# Patient Record
Sex: Female | Born: 1935 | Race: White | Hispanic: No
Health system: Southern US, Community
[De-identification: ages and names within clinical notes are randomized; demographics above are authoritative.]

---

## 1998-01-25 ENCOUNTER — Ambulatory Visit (HOSPITAL_COMMUNITY): Admission: RE | Admit: 1998-01-25 | Discharge: 1998-01-25 | Payer: Self-pay | Admitting: Orthopedic Surgery

## 1999-05-19 ENCOUNTER — Encounter: Admission: RE | Admit: 1999-05-19 | Discharge: 1999-05-19 | Payer: Self-pay | Admitting: Internal Medicine

## 2000-07-27 ENCOUNTER — Encounter: Payer: Self-pay | Admitting: Emergency Medicine

## 2000-07-27 ENCOUNTER — Emergency Department (HOSPITAL_COMMUNITY): Admission: EM | Admit: 2000-07-27 | Discharge: 2000-07-28 | Payer: Self-pay | Admitting: Emergency Medicine

## 2000-07-28 ENCOUNTER — Encounter: Payer: Self-pay | Admitting: Emergency Medicine

## 2002-12-08 ENCOUNTER — Encounter: Payer: Self-pay | Admitting: Emergency Medicine

## 2002-12-08 ENCOUNTER — Inpatient Hospital Stay (HOSPITAL_COMMUNITY): Admission: EM | Admit: 2002-12-08 | Discharge: 2002-12-11 | Payer: Self-pay | Admitting: Emergency Medicine

## 2003-04-23 ENCOUNTER — Ambulatory Visit (HOSPITAL_COMMUNITY): Admission: RE | Admit: 2003-04-23 | Discharge: 2003-04-23 | Payer: Self-pay | Admitting: Internal Medicine

## 2004-03-24 ENCOUNTER — Ambulatory Visit (HOSPITAL_COMMUNITY): Admission: RE | Admit: 2004-03-24 | Discharge: 2004-03-24 | Payer: Self-pay | Admitting: Internal Medicine

## 2004-06-28 ENCOUNTER — Ambulatory Visit: Payer: Self-pay | Admitting: Internal Medicine

## 2004-07-12 ENCOUNTER — Ambulatory Visit: Payer: Self-pay | Admitting: Internal Medicine

## 2004-10-02 ENCOUNTER — Ambulatory Visit: Payer: Self-pay | Admitting: Internal Medicine

## 2004-10-11 ENCOUNTER — Encounter (HOSPITAL_COMMUNITY): Admission: RE | Admit: 2004-10-11 | Discharge: 2005-01-09 | Payer: Self-pay | Admitting: Internal Medicine

## 2004-10-18 ENCOUNTER — Ambulatory Visit: Payer: Self-pay | Admitting: Endocrinology

## 2004-10-27 ENCOUNTER — Ambulatory Visit (HOSPITAL_COMMUNITY): Admission: RE | Admit: 2004-10-27 | Discharge: 2004-10-27 | Payer: Self-pay | Admitting: Endocrinology

## 2004-12-19 ENCOUNTER — Ambulatory Visit: Payer: Self-pay | Admitting: Endocrinology

## 2004-12-27 ENCOUNTER — Ambulatory Visit: Payer: Self-pay | Admitting: Internal Medicine

## 2005-01-18 ENCOUNTER — Ambulatory Visit: Payer: Self-pay | Admitting: Endocrinology

## 2005-01-24 ENCOUNTER — Ambulatory Visit: Payer: Self-pay | Admitting: Internal Medicine

## 2005-02-01 ENCOUNTER — Ambulatory Visit: Payer: Self-pay | Admitting: Internal Medicine

## 2005-02-15 ENCOUNTER — Ambulatory Visit: Payer: Self-pay | Admitting: Endocrinology

## 2005-04-19 ENCOUNTER — Ambulatory Visit: Payer: Self-pay | Admitting: Endocrinology

## 2005-05-14 ENCOUNTER — Ambulatory Visit: Payer: Self-pay | Admitting: Family Medicine

## 2005-05-21 ENCOUNTER — Ambulatory Visit: Payer: Self-pay | Admitting: Internal Medicine

## 2005-07-02 ENCOUNTER — Ambulatory Visit: Payer: Self-pay | Admitting: Internal Medicine

## 2005-08-13 ENCOUNTER — Ambulatory Visit: Payer: Self-pay | Admitting: Internal Medicine

## 2005-08-28 ENCOUNTER — Ambulatory Visit: Payer: Self-pay | Admitting: Endocrinology

## 2005-09-05 ENCOUNTER — Ambulatory Visit: Payer: Self-pay | Admitting: Pulmonary Disease

## 2005-12-20 ENCOUNTER — Ambulatory Visit: Payer: Self-pay | Admitting: Internal Medicine

## 2006-02-13 ENCOUNTER — Ambulatory Visit: Payer: Self-pay | Admitting: Internal Medicine

## 2006-03-01 ENCOUNTER — Ambulatory Visit: Payer: Self-pay | Admitting: Licensed Clinical Social Worker

## 2006-05-14 ENCOUNTER — Ambulatory Visit: Payer: Self-pay | Admitting: Internal Medicine

## 2006-06-27 ENCOUNTER — Ambulatory Visit: Payer: Self-pay | Admitting: Internal Medicine

## 2006-07-17 ENCOUNTER — Ambulatory Visit: Admission: RE | Admit: 2006-07-17 | Discharge: 2006-07-17 | Payer: Self-pay | Admitting: Internal Medicine

## 2006-07-17 ENCOUNTER — Encounter (INDEPENDENT_AMBULATORY_CARE_PROVIDER_SITE_OTHER): Payer: Self-pay | Admitting: *Deleted

## 2006-07-17 ENCOUNTER — Ambulatory Visit: Payer: Self-pay | Admitting: Internal Medicine

## 2006-07-25 ENCOUNTER — Ambulatory Visit: Payer: Self-pay | Admitting: Internal Medicine

## 2006-08-01 LAB — COMPREHENSIVE METABOLIC PANEL
AST: 24 U/L (ref 0–37)
BUN: 10 mg/dL (ref 6–23)
Calcium: 9.5 mg/dL (ref 8.4–10.5)
Chloride: 105 mEq/L (ref 96–112)
Creatinine, Ser: 0.84 mg/dL (ref 0.40–1.20)
Total Bilirubin: 0.7 mg/dL (ref 0.3–1.2)

## 2006-08-01 LAB — IGG, IGA, IGM
IgA: 215 mg/dL (ref 68–378)
IgG (Immunoglobin G), Serum: 1240 mg/dL (ref 694–1618)
IgM, Serum: 161 mg/dL (ref 60–263)

## 2006-08-01 LAB — CBC WITH DIFFERENTIAL/PLATELET
Basophils Absolute: 0 10*3/uL (ref 0.0–0.1)
EOS%: 6.9 % (ref 0.0–7.0)
HCT: 40.9 % (ref 34.8–46.6)
HGB: 13.7 g/dL (ref 11.6–15.9)
LYMPH%: 12.7 % — ABNORMAL LOW (ref 14.0–48.0)
MCH: 31.4 pg (ref 26.0–34.0)
MCV: 94.1 fL (ref 81.0–101.0)
MONO%: 8.5 % (ref 0.0–13.0)
NEUT%: 71.4 % (ref 39.6–76.8)
Platelets: 334 10*3/uL (ref 145–400)
lymph#: 1.1 10*3/uL (ref 0.9–3.3)

## 2006-08-02 ENCOUNTER — Ambulatory Visit (HOSPITAL_COMMUNITY): Admission: RE | Admit: 2006-08-02 | Discharge: 2006-08-02 | Payer: Self-pay | Admitting: Internal Medicine

## 2006-08-06 ENCOUNTER — Ambulatory Visit (HOSPITAL_COMMUNITY): Admission: RE | Admit: 2006-08-06 | Discharge: 2006-08-06 | Payer: Self-pay | Admitting: Internal Medicine

## 2006-08-07 LAB — CBC WITH DIFFERENTIAL/PLATELET
EOS%: 7.6 % — ABNORMAL HIGH (ref 0.0–7.0)
MCH: 31.1 pg (ref 26.0–34.0)
MCV: 92.2 fL (ref 81.0–101.0)
MONO%: 8.7 % (ref 0.0–13.0)
NEUT#: 4.7 10*3/uL (ref 1.5–6.5)
RBC: 4.06 10*6/uL (ref 3.70–5.32)
RDW: 11.5 % (ref 11.3–14.5)
lymph#: 1.2 10*3/uL (ref 0.9–3.3)

## 2006-08-07 LAB — COMPREHENSIVE METABOLIC PANEL
ALT: 9 U/L (ref 0–35)
AST: 24 U/L (ref 0–37)
Albumin: 4.2 g/dL (ref 3.5–5.2)
Alkaline Phosphatase: 111 U/L (ref 39–117)
Potassium: 4.8 mEq/L (ref 3.5–5.3)
Sodium: 141 mEq/L (ref 135–145)
Total Protein: 7 g/dL (ref 6.0–8.3)

## 2006-08-14 LAB — CBC WITH DIFFERENTIAL/PLATELET
Basophils Absolute: 0 10*3/uL (ref 0.0–0.1)
EOS%: 7.5 % — ABNORMAL HIGH (ref 0.0–7.0)
MCH: 30.5 pg (ref 26.0–34.0)
MCHC: 32.5 g/dL (ref 32.0–36.0)
MCV: 93.8 fL (ref 81.0–101.0)
MONO%: 4.9 % (ref 0.0–13.0)
RBC: 3.56 10*6/uL — ABNORMAL LOW (ref 3.70–5.32)
RDW: 13 % (ref 11.3–14.5)

## 2006-08-14 LAB — COMPREHENSIVE METABOLIC PANEL
AST: 16 U/L (ref 0–37)
Albumin: 4 g/dL (ref 3.5–5.2)
Alkaline Phosphatase: 122 U/L — ABNORMAL HIGH (ref 39–117)
BUN: 13 mg/dL (ref 6–23)
Potassium: 4.1 mEq/L (ref 3.5–5.3)

## 2006-08-21 LAB — COMPREHENSIVE METABOLIC PANEL
ALT: 12 U/L (ref 0–35)
AST: 16 U/L (ref 0–37)
Alkaline Phosphatase: 142 U/L — ABNORMAL HIGH (ref 39–117)
BUN: 8 mg/dL (ref 6–23)
Calcium: 8.7 mg/dL (ref 8.4–10.5)
Creatinine, Ser: 0.81 mg/dL (ref 0.40–1.20)
Total Bilirubin: 0.3 mg/dL (ref 0.3–1.2)

## 2006-08-21 LAB — CBC WITH DIFFERENTIAL/PLATELET
BASO%: 0.1 % (ref 0.0–2.0)
Basophils Absolute: 0 10*3/uL (ref 0.0–0.1)
EOS%: 0.8 % (ref 0.0–7.0)
HCT: 32.3 % — ABNORMAL LOW (ref 34.8–46.6)
HGB: 10.8 g/dL — ABNORMAL LOW (ref 11.6–15.9)
LYMPH%: 8.6 % — ABNORMAL LOW (ref 14.0–48.0)
MCH: 31.3 pg (ref 26.0–34.0)
MCHC: 33.3 g/dL (ref 32.0–36.0)
MCV: 94.1 fL (ref 81.0–101.0)
MONO%: 6 % (ref 0.0–13.0)
NEUT%: 84.5 % — ABNORMAL HIGH (ref 39.6–76.8)

## 2006-08-26 ENCOUNTER — Ambulatory Visit: Admission: RE | Admit: 2006-08-26 | Discharge: 2006-11-05 | Payer: Self-pay | Admitting: Radiation Oncology

## 2006-08-28 LAB — COMPREHENSIVE METABOLIC PANEL
ALT: 8 U/L (ref 0–35)
BUN: 11 mg/dL (ref 6–23)
CO2: 25 mEq/L (ref 19–32)
Calcium: 8.9 mg/dL (ref 8.4–10.5)
Chloride: 110 mEq/L (ref 96–112)
Creatinine, Ser: 0.94 mg/dL (ref 0.40–1.20)
Glucose, Bld: 83 mg/dL (ref 70–99)

## 2006-08-28 LAB — CBC WITH DIFFERENTIAL/PLATELET
BASO%: 1.1 % (ref 0.0–2.0)
Eosinophils Absolute: 0.1 10*3/uL (ref 0.0–0.5)
HCT: 32.6 % — ABNORMAL LOW (ref 34.8–46.6)
MCHC: 33.8 g/dL (ref 32.0–36.0)
MONO#: 0.9 10*3/uL (ref 0.1–0.9)
NEUT#: 6.3 10*3/uL (ref 1.5–6.5)
NEUT%: 74.2 % (ref 39.6–76.8)
RBC: 3.49 10*6/uL — ABNORMAL LOW (ref 3.70–5.32)
WBC: 8.5 10*3/uL (ref 3.9–10.0)
lymph#: 1.1 10*3/uL (ref 0.9–3.3)

## 2006-09-04 LAB — COMPREHENSIVE METABOLIC PANEL
ALT: 10 U/L (ref 0–35)
Alkaline Phosphatase: 161 U/L — ABNORMAL HIGH (ref 39–117)
Sodium: 143 mEq/L (ref 135–145)
Total Bilirubin: 0.5 mg/dL (ref 0.3–1.2)
Total Protein: 6.2 g/dL (ref 6.0–8.3)

## 2006-09-04 LAB — CBC WITH DIFFERENTIAL/PLATELET
Basophils Absolute: 0 10*3/uL (ref 0.0–0.1)
HCT: 28.2 % — ABNORMAL LOW (ref 34.8–46.6)
HGB: 9.8 g/dL — ABNORMAL LOW (ref 11.6–15.9)
MONO#: 0.4 10*3/uL (ref 0.1–0.9)
NEUT%: 86.2 % — ABNORMAL HIGH (ref 39.6–76.8)
WBC: 15.6 10*3/uL — ABNORMAL HIGH (ref 3.9–10.0)
lymph#: 1.5 10*3/uL (ref 0.9–3.3)

## 2006-09-09 ENCOUNTER — Ambulatory Visit: Payer: Self-pay | Admitting: Internal Medicine

## 2006-09-11 ENCOUNTER — Ambulatory Visit (HOSPITAL_COMMUNITY): Admission: RE | Admit: 2006-09-11 | Discharge: 2006-09-11 | Payer: Self-pay | Admitting: Internal Medicine

## 2006-09-11 LAB — COMPREHENSIVE METABOLIC PANEL
Alkaline Phosphatase: 189 U/L — ABNORMAL HIGH (ref 39–117)
BUN: 7 mg/dL (ref 6–23)
Creatinine, Ser: 0.84 mg/dL (ref 0.40–1.20)
Glucose, Bld: 79 mg/dL (ref 70–99)
Sodium: 142 mEq/L (ref 135–145)
Total Bilirubin: 0.3 mg/dL (ref 0.3–1.2)
Total Protein: 7.4 g/dL (ref 6.0–8.3)

## 2006-09-11 LAB — CBC WITH DIFFERENTIAL/PLATELET
Eosinophils Absolute: 0.3 10*3/uL (ref 0.0–0.5)
LYMPH%: 9.9 % — ABNORMAL LOW (ref 14.0–48.0)
MCHC: 33.6 g/dL (ref 32.0–36.0)
MCV: 95.2 fL (ref 81.0–101.0)
MONO%: 5.3 % (ref 0.0–13.0)
NEUT#: 16.1 10*3/uL — ABNORMAL HIGH (ref 1.5–6.5)
NEUT%: 82.6 % — ABNORMAL HIGH (ref 39.6–76.8)
Platelets: 175 10*3/uL (ref 145–400)
RBC: 3.38 10*6/uL — ABNORMAL LOW (ref 3.70–5.32)

## 2006-09-18 LAB — CBC WITH DIFFERENTIAL/PLATELET
Basophils Absolute: 0.1 10*3/uL (ref 0.0–0.1)
Eosinophils Absolute: 0.2 10*3/uL (ref 0.0–0.5)
HCT: 33.3 % — ABNORMAL LOW (ref 34.8–46.6)
HGB: 11.3 g/dL — ABNORMAL LOW (ref 11.6–15.9)
LYMPH%: 9.3 % — ABNORMAL LOW (ref 14.0–48.0)
MCV: 94.9 fL (ref 81.0–101.0)
MONO#: 0.7 10*3/uL (ref 0.1–0.9)
MONO%: 7.9 % (ref 0.0–13.0)
NEUT#: 7 10*3/uL — ABNORMAL HIGH (ref 1.5–6.5)
NEUT%: 79.4 % — ABNORMAL HIGH (ref 39.6–76.8)
Platelets: 316 10*3/uL (ref 145–400)

## 2006-09-18 LAB — COMPREHENSIVE METABOLIC PANEL
Alkaline Phosphatase: 118 U/L — ABNORMAL HIGH (ref 39–117)
BUN: 12 mg/dL (ref 6–23)
CO2: 23 mEq/L (ref 19–32)
Glucose, Bld: 82 mg/dL (ref 70–99)
Total Bilirubin: 0.3 mg/dL (ref 0.3–1.2)

## 2006-09-26 LAB — URINALYSIS, MICROSCOPIC - CHCC
Bilirubin (Urine): NEGATIVE
Blood: NEGATIVE
Glucose: NEGATIVE g/dL
Ketones: NEGATIVE mg/dL
Leukocyte Esterase: NEGATIVE
RBC count: NEGATIVE (ref 0–2)
pH: 7 (ref 4.6–8.0)

## 2006-09-28 LAB — URINE CULTURE

## 2006-10-09 LAB — CBC WITH DIFFERENTIAL/PLATELET
Basophils Absolute: 0.1 10*3/uL (ref 0.0–0.1)
Eosinophils Absolute: 0.2 10*3/uL (ref 0.0–0.5)
HCT: 30.9 % — ABNORMAL LOW (ref 34.8–46.6)
HGB: 10.3 g/dL — ABNORMAL LOW (ref 11.6–15.9)
LYMPH%: 3.9 % — ABNORMAL LOW (ref 14.0–48.0)
MCHC: 33.2 g/dL (ref 32.0–36.0)
MONO#: 0.9 10*3/uL (ref 0.1–0.9)
NEUT#: 8.9 10*3/uL — ABNORMAL HIGH (ref 1.5–6.5)
NEUT%: 85 % — ABNORMAL HIGH (ref 39.6–76.8)
Platelets: 219 10*3/uL (ref 145–400)
WBC: 10.4 10*3/uL — ABNORMAL HIGH (ref 3.9–10.0)
lymph#: 0.4 10*3/uL — ABNORMAL LOW (ref 0.9–3.3)

## 2006-10-09 LAB — COMPREHENSIVE METABOLIC PANEL
ALT: <8 U/L (ref 0–35)
BUN: 11 mg/dL (ref 6–23)
CO2: 23 mEq/L (ref 19–32)
Creatinine, Ser: 0.81 mg/dL (ref 0.40–1.20)
Total Bilirubin: 0.4 mg/dL (ref 0.3–1.2)

## 2006-10-28 ENCOUNTER — Ambulatory Visit (HOSPITAL_COMMUNITY): Admission: RE | Admit: 2006-10-28 | Discharge: 2006-10-28 | Payer: Self-pay | Admitting: Internal Medicine

## 2006-10-28 ENCOUNTER — Ambulatory Visit: Payer: Self-pay | Admitting: Internal Medicine

## 2006-10-30 LAB — COMPREHENSIVE METABOLIC PANEL
ALT: 10 U/L (ref 0–35)
AST: 18 U/L (ref 0–37)
BUN: 8 mg/dL (ref 6–23)
CO2: 25 mEq/L (ref 19–32)
Calcium: 8.8 mg/dL (ref 8.4–10.5)
Chloride: 110 mEq/L (ref 96–112)
Creatinine, Ser: 0.84 mg/dL (ref 0.40–1.20)
Total Bilirubin: 0.3 mg/dL (ref 0.3–1.2)

## 2006-10-30 LAB — CBC WITH DIFFERENTIAL/PLATELET
BASO%: 0.4 % (ref 0.0–2.0)
Basophils Absolute: 0 10*3/uL (ref 0.0–0.1)
EOS%: 2 % (ref 0.0–7.0)
HCT: 28.9 % — ABNORMAL LOW (ref 34.8–46.6)
HGB: 9.9 g/dL — ABNORMAL LOW (ref 11.6–15.9)
LYMPH%: 5.3 % — ABNORMAL LOW (ref 14.0–48.0)
MCH: 34.9 pg — ABNORMAL HIGH (ref 26.0–34.0)
MCHC: 34.3 g/dL (ref 32.0–36.0)
MCV: 101.7 fL — ABNORMAL HIGH (ref 81.0–101.0)
NEUT%: 79.6 % — ABNORMAL HIGH (ref 39.6–76.8)
Platelets: 341 10*3/uL (ref 145–400)

## 2006-11-19 ENCOUNTER — Ambulatory Visit: Payer: Self-pay | Admitting: Internal Medicine

## 2006-11-26 ENCOUNTER — Ambulatory Visit: Payer: Self-pay | Admitting: Internal Medicine

## 2006-12-20 ENCOUNTER — Ambulatory Visit: Payer: Self-pay | Admitting: Internal Medicine

## 2006-12-25 ENCOUNTER — Ambulatory Visit (HOSPITAL_COMMUNITY): Admission: RE | Admit: 2006-12-25 | Discharge: 2006-12-25 | Payer: Self-pay | Admitting: Internal Medicine

## 2007-01-01 ENCOUNTER — Ambulatory Visit: Admission: RE | Admit: 2007-01-01 | Discharge: 2007-02-04 | Payer: Self-pay | Admitting: Radiation Oncology

## 2007-01-03 ENCOUNTER — Ambulatory Visit: Payer: Self-pay | Admitting: Internal Medicine

## 2007-01-29 ENCOUNTER — Ambulatory Visit: Payer: Self-pay | Admitting: Internal Medicine

## 2007-01-29 LAB — CBC WITH DIFFERENTIAL/PLATELET
Basophils Absolute: 0 10*3/uL (ref 0.0–0.1)
HCT: 37.6 % (ref 34.8–46.6)
HGB: 12.8 g/dL (ref 11.6–15.9)
LYMPH%: 7.5 % — ABNORMAL LOW (ref 14.0–48.0)
MCHC: 34 g/dL (ref 32.0–36.0)
MONO#: 0.7 10*3/uL (ref 0.1–0.9)
NEUT%: 77.3 % — ABNORMAL HIGH (ref 39.6–76.8)
Platelets: 240 10*3/uL (ref 145–400)
WBC: 6.3 10*3/uL (ref 3.9–10.0)
lymph#: 0.5 10*3/uL — ABNORMAL LOW (ref 0.9–3.3)

## 2007-01-29 LAB — COMPREHENSIVE METABOLIC PANEL
BUN: 10 mg/dL (ref 6–23)
CO2: 28 mEq/L (ref 19–32)
Calcium: 9.5 mg/dL (ref 8.4–10.5)
Chloride: 105 mEq/L (ref 96–112)
Creatinine, Ser: 0.91 mg/dL (ref 0.40–1.20)
Glucose, Bld: 94 mg/dL (ref 70–99)
Total Bilirubin: 0.5 mg/dL (ref 0.3–1.2)

## 2007-02-18 ENCOUNTER — Ambulatory Visit (HOSPITAL_COMMUNITY): Admission: RE | Admit: 2007-02-18 | Discharge: 2007-02-18 | Payer: Self-pay | Admitting: Internal Medicine

## 2007-02-19 ENCOUNTER — Ambulatory Visit: Payer: Self-pay | Admitting: Internal Medicine

## 2007-02-20 LAB — CBC WITH DIFFERENTIAL/PLATELET
BASO%: 0 % (ref 0.0–2.0)
Basophils Absolute: 0 10*3/uL (ref 0.0–0.1)
Eosinophils Absolute: 0.1 10*3/uL (ref 0.0–0.5)
HCT: 37.6 % (ref 34.8–46.6)
HGB: 12.8 g/dL (ref 11.6–15.9)
LYMPH%: 1.7 % — ABNORMAL LOW (ref 14.0–48.0)
MCHC: 34.1 g/dL (ref 32.0–36.0)
MONO#: 0.2 10*3/uL (ref 0.1–0.9)
NEUT#: 12.4 10*3/uL — ABNORMAL HIGH (ref 1.5–6.5)
NEUT%: 96.2 % — ABNORMAL HIGH (ref 39.6–76.8)
Platelets: 264 10*3/uL (ref 145–400)
WBC: 12.9 10*3/uL — ABNORMAL HIGH (ref 3.9–10.0)
lymph#: 0.2 10*3/uL — ABNORMAL LOW (ref 0.9–3.3)

## 2007-02-20 LAB — COMPREHENSIVE METABOLIC PANEL
ALT: 13 U/L (ref 0–35)
CO2: 24 mEq/L (ref 19–32)
Calcium: 9.1 mg/dL (ref 8.4–10.5)
Chloride: 107 mEq/L (ref 96–112)
Creatinine, Ser: 0.87 mg/dL (ref 0.40–1.20)
Glucose, Bld: 107 mg/dL — ABNORMAL HIGH (ref 70–99)

## 2007-04-14 ENCOUNTER — Ambulatory Visit: Payer: Self-pay | Admitting: Internal Medicine

## 2007-04-16 ENCOUNTER — Ambulatory Visit (HOSPITAL_COMMUNITY): Admission: RE | Admit: 2007-04-16 | Discharge: 2007-04-16 | Payer: Self-pay | Admitting: Internal Medicine

## 2007-04-16 LAB — COMPREHENSIVE METABOLIC PANEL
ALT: 15 U/L (ref 0–35)
Albumin: 4 g/dL (ref 3.5–5.2)
CO2: 30 mEq/L (ref 19–32)
Calcium: 9.6 mg/dL (ref 8.4–10.5)
Chloride: 105 mEq/L (ref 96–112)
Sodium: 142 mEq/L (ref 135–145)
Total Protein: 7.2 g/dL (ref 6.0–8.3)

## 2007-04-16 LAB — CBC WITH DIFFERENTIAL/PLATELET
BASO%: 0 % (ref 0.0–2.0)
Eosinophils Absolute: 0.1 10*3/uL (ref 0.0–0.5)
HCT: 41.4 % (ref 34.8–46.6)
MCHC: 35 g/dL (ref 32.0–36.0)
MONO#: 0.1 10*3/uL (ref 0.1–0.9)
NEUT#: 8.4 10*3/uL — ABNORMAL HIGH (ref 1.5–6.5)
RBC: 4.25 10*6/uL (ref 3.70–5.32)
WBC: 8.9 10*3/uL (ref 3.9–10.0)
lymph#: 0.3 10*3/uL — ABNORMAL LOW (ref 0.9–3.3)

## 2007-04-24 ENCOUNTER — Encounter: Payer: Self-pay | Admitting: *Deleted

## 2007-04-24 DIAGNOSIS — J329 Chronic sinusitis, unspecified: Secondary | ICD-10-CM | POA: Insufficient documentation

## 2007-04-24 DIAGNOSIS — E0590 Thyrotoxicosis, unspecified without thyrotoxic crisis or storm: Secondary | ICD-10-CM | POA: Insufficient documentation

## 2007-04-24 DIAGNOSIS — I6350 Cerebral infarction due to unspecified occlusion or stenosis of unspecified cerebral artery: Secondary | ICD-10-CM | POA: Insufficient documentation

## 2007-04-24 DIAGNOSIS — E785 Hyperlipidemia, unspecified: Secondary | ICD-10-CM

## 2007-04-24 DIAGNOSIS — K5730 Diverticulosis of large intestine without perforation or abscess without bleeding: Secondary | ICD-10-CM | POA: Insufficient documentation

## 2007-04-24 DIAGNOSIS — J449 Chronic obstructive pulmonary disease, unspecified: Secondary | ICD-10-CM | POA: Insufficient documentation

## 2007-04-24 DIAGNOSIS — J4489 Other specified chronic obstructive pulmonary disease: Secondary | ICD-10-CM | POA: Insufficient documentation

## 2007-04-24 DIAGNOSIS — M810 Age-related osteoporosis without current pathological fracture: Secondary | ICD-10-CM | POA: Insufficient documentation

## 2007-05-07 ENCOUNTER — Ambulatory Visit: Payer: Self-pay | Admitting: Internal Medicine

## 2007-05-16 ENCOUNTER — Ambulatory Visit: Payer: Self-pay | Admitting: Internal Medicine

## 2007-05-26 ENCOUNTER — Ambulatory Visit: Payer: Self-pay | Admitting: Internal Medicine

## 2007-05-26 LAB — COMPREHENSIVE METABOLIC PANEL
ALT: 9 U/L (ref 0–35)
CO2: 25 mEq/L (ref 19–32)
Chloride: 110 mEq/L (ref 96–112)
Sodium: 145 mEq/L (ref 135–145)
Total Bilirubin: 0.4 mg/dL (ref 0.3–1.2)
Total Protein: 6.8 g/dL (ref 6.0–8.3)

## 2007-05-26 LAB — CBC WITH DIFFERENTIAL/PLATELET
BASO%: 0.4 % (ref 0.0–2.0)
LYMPH%: 3.9 % — ABNORMAL LOW (ref 14.0–48.0)
MCHC: 34.8 g/dL (ref 32.0–36.0)
MONO#: 0.5 10*3/uL (ref 0.1–0.9)
RBC: 4.14 10*6/uL (ref 3.70–5.32)
RDW: 12.7 % (ref 11.3–14.5)
WBC: 8.7 10*3/uL (ref 3.9–10.0)
lymph#: 0.3 10*3/uL — ABNORMAL LOW (ref 0.9–3.3)

## 2007-06-05 LAB — CBC WITH DIFFERENTIAL/PLATELET
BASO%: 0.5 % (ref 0.0–2.0)
EOS%: 1.4 % (ref 0.0–7.0)
LYMPH%: 4.6 % — ABNORMAL LOW (ref 14.0–48.0)
MCHC: 34.8 g/dL (ref 32.0–36.0)
MONO#: 0.4 10*3/uL (ref 0.1–0.9)
MONO%: 4.4 % (ref 0.0–13.0)
Platelets: 196 10*3/uL (ref 145–400)
RBC: 3.76 10*6/uL (ref 3.70–5.32)
WBC: 8.8 10*3/uL (ref 3.9–10.0)

## 2007-06-05 LAB — COMPREHENSIVE METABOLIC PANEL
ALT: 9 U/L (ref 0–35)
AST: 19 U/L (ref 0–37)
Alkaline Phosphatase: 61 U/L (ref 39–117)
Sodium: 144 mEq/L (ref 135–145)
Total Bilirubin: 0.5 mg/dL (ref 0.3–1.2)
Total Protein: 6.5 g/dL (ref 6.0–8.3)

## 2007-06-11 DIAGNOSIS — C3490 Malignant neoplasm of unspecified part of unspecified bronchus or lung: Secondary | ICD-10-CM | POA: Insufficient documentation

## 2007-06-19 LAB — CBC WITH DIFFERENTIAL/PLATELET
BASO%: 2.4 % — ABNORMAL HIGH (ref 0.0–2.0)
Basophils Absolute: 0.1 10*3/uL (ref 0.0–0.1)
EOS%: 5.2 % (ref 0.0–7.0)
HCT: 33.7 % — ABNORMAL LOW (ref 34.8–46.6)
LYMPH%: 19.7 % (ref 14.0–48.0)
MCH: 34.3 pg — ABNORMAL HIGH (ref 26.0–34.0)
MCHC: 35.8 g/dL (ref 32.0–36.0)
NEUT%: 56.7 % (ref 39.6–76.8)
Platelets: 212 10*3/uL (ref 145–400)

## 2007-06-19 LAB — COMPREHENSIVE METABOLIC PANEL
ALT: 11 U/L (ref 0–35)
AST: 19 U/L (ref 0–37)
BUN: 10 mg/dL (ref 6–23)
CO2: 20 mEq/L (ref 19–32)
Creatinine, Ser: 0.91 mg/dL (ref 0.40–1.20)
Total Bilirubin: 0.4 mg/dL (ref 0.3–1.2)

## 2007-06-19 LAB — MAGNESIUM: Magnesium: 2 mg/dL (ref 1.5–2.5)

## 2007-06-27 LAB — COMPREHENSIVE METABOLIC PANEL
AST: 21 U/L (ref 0–37)
Albumin: 4.1 g/dL (ref 3.5–5.2)
BUN: 12 mg/dL (ref 6–23)
CO2: 20 mEq/L (ref 19–32)
Calcium: 9 mg/dL (ref 8.4–10.5)
Chloride: 114 mEq/L — ABNORMAL HIGH (ref 96–112)
Creatinine, Ser: 0.9 mg/dL (ref 0.40–1.20)
Glucose, Bld: 67 mg/dL — ABNORMAL LOW (ref 70–99)
Potassium: 3.9 mEq/L (ref 3.5–5.3)

## 2007-06-27 LAB — CBC WITH DIFFERENTIAL/PLATELET
Basophils Absolute: 0 10*3/uL (ref 0.0–0.1)
EOS%: 2.1 % (ref 0.0–7.0)
Eosinophils Absolute: 0.1 10*3/uL (ref 0.0–0.5)
HCT: 32.6 % — ABNORMAL LOW (ref 34.8–46.6)
HGB: 11.2 g/dL — ABNORMAL LOW (ref 11.6–15.9)
MCH: 33.4 pg (ref 26.0–34.0)
NEUT#: 2.9 10*3/uL (ref 1.5–6.5)
NEUT%: 72.2 % (ref 39.6–76.8)
RDW: 12.5 % (ref 11.3–14.5)
lymph#: 0.5 10*3/uL — ABNORMAL LOW (ref 0.9–3.3)

## 2007-06-27 LAB — MAGNESIUM: Magnesium: 2 mg/dL (ref 1.5–2.5)

## 2007-07-10 LAB — CBC WITH DIFFERENTIAL/PLATELET
Basophils Absolute: 0.1 10*3/uL (ref 0.0–0.1)
Eosinophils Absolute: 0.1 10*3/uL (ref 0.0–0.5)
HGB: 10.5 g/dL — ABNORMAL LOW (ref 11.6–15.9)
MONO%: 20.3 % — ABNORMAL HIGH (ref 0.0–13.0)
NEUT#: 2.4 10*3/uL (ref 1.5–6.5)
RBC: 3.12 10*6/uL — ABNORMAL LOW (ref 3.70–5.32)
RDW: 13.5 % (ref 11.3–14.5)
WBC: 4 10*3/uL (ref 3.9–10.0)
lymph#: 0.6 10*3/uL — ABNORMAL LOW (ref 0.9–3.3)

## 2007-07-10 LAB — COMPREHENSIVE METABOLIC PANEL
AST: 17 U/L (ref 0–37)
Alkaline Phosphatase: 60 U/L (ref 39–117)
BUN: 13 mg/dL (ref 6–23)
Chloride: 114 mEq/L — ABNORMAL HIGH (ref 96–112)
Glucose, Bld: 97 mg/dL (ref 70–99)
Potassium: 4.7 mEq/L (ref 3.5–5.3)
Sodium: 144 mEq/L (ref 135–145)

## 2007-07-10 LAB — MAGNESIUM: Magnesium: 2 mg/dL (ref 1.5–2.5)

## 2007-07-15 ENCOUNTER — Ambulatory Visit (HOSPITAL_COMMUNITY): Admission: RE | Admit: 2007-07-15 | Discharge: 2007-07-15 | Payer: Self-pay | Admitting: Internal Medicine

## 2007-07-15 ENCOUNTER — Ambulatory Visit: Payer: Self-pay | Admitting: Internal Medicine

## 2007-07-17 LAB — CBC WITH DIFFERENTIAL/PLATELET
BASO%: 0.4 % (ref 0.0–2.0)
EOS%: 2.2 % (ref 0.0–7.0)
HCT: 31 % — ABNORMAL LOW (ref 34.8–46.6)
LYMPH%: 15.6 % (ref 14.0–48.0)
MCH: 34.8 pg — ABNORMAL HIGH (ref 26.0–34.0)
MCHC: 35.1 g/dL (ref 32.0–36.0)
MONO#: 0.6 10*3/uL (ref 0.1–0.9)
MONO%: 16.8 % — ABNORMAL HIGH (ref 0.0–13.0)
NEUT%: 65 % (ref 39.6–76.8)
Platelets: 248 10*3/uL (ref 145–400)
RBC: 3.12 10*6/uL — ABNORMAL LOW (ref 3.70–5.32)
WBC: 3.4 10*3/uL — ABNORMAL LOW (ref 3.9–10.0)

## 2007-07-30 LAB — CBC WITH DIFFERENTIAL/PLATELET
BASO%: 1.2 % (ref 0.0–2.0)
EOS%: 3.3 % (ref 0.0–7.0)
MCH: 34.8 pg — ABNORMAL HIGH (ref 26.0–34.0)
MCHC: 34.4 g/dL (ref 32.0–36.0)
MONO#: 0.6 10*3/uL (ref 0.1–0.9)
NEUT%: 70.1 % (ref 39.6–76.8)
RBC: 2.94 10*6/uL — ABNORMAL LOW (ref 3.70–5.32)
RDW: 15.2 % — ABNORMAL HIGH (ref 11.3–14.5)
WBC: 4.2 10*3/uL (ref 3.9–10.0)
lymph#: 0.5 10*3/uL — ABNORMAL LOW (ref 0.9–3.3)

## 2007-07-30 LAB — COMPREHENSIVE METABOLIC PANEL
ALT: 10 U/L (ref 0–35)
AST: 22 U/L (ref 0–37)
CO2: 19 mEq/L (ref 19–32)
Calcium: 9.1 mg/dL (ref 8.4–10.5)
Chloride: 112 mEq/L (ref 96–112)
Creatinine, Ser: 0.86 mg/dL (ref 0.40–1.20)
Potassium: 4.4 mEq/L (ref 3.5–5.3)
Sodium: 143 mEq/L (ref 135–145)
Total Protein: 6.5 g/dL (ref 6.0–8.3)

## 2007-08-07 LAB — CBC WITH DIFFERENTIAL/PLATELET
BASO%: 1.5 % (ref 0.0–2.0)
Basophils Absolute: 0.1 10*3/uL (ref 0.0–0.1)
EOS%: 1.3 % (ref 0.0–7.0)
HGB: 10.5 g/dL — ABNORMAL LOW (ref 11.6–15.9)
MCH: 35.9 pg — ABNORMAL HIGH (ref 26.0–34.0)
MCHC: 35 g/dL (ref 32.0–36.0)
MCV: 102 fL — ABNORMAL HIGH (ref 81.0–101.0)
MONO%: 11.7 % (ref 0.0–13.0)
NEUT%: 71.1 % (ref 39.6–76.8)
RDW: 16 % — ABNORMAL HIGH (ref 11.3–14.5)

## 2007-08-14 LAB — CBC WITH DIFFERENTIAL/PLATELET
BASO%: 1.2 % (ref 0.0–2.0)
EOS%: 1.2 % (ref 0.0–7.0)
Eosinophils Absolute: 0.1 10*3/uL (ref 0.0–0.5)
LYMPH%: 19.5 % (ref 14.0–48.0)
MCH: 35.6 pg — ABNORMAL HIGH (ref 26.0–34.0)
MCHC: 34.6 g/dL (ref 32.0–36.0)
MCV: 103 fL — ABNORMAL HIGH (ref 81.0–101.0)
MONO%: 11 % (ref 0.0–13.0)
Platelets: 184 10*3/uL (ref 145–400)
RBC: 3.03 10*6/uL — ABNORMAL LOW (ref 3.70–5.32)
RDW: 15.1 % — ABNORMAL HIGH (ref 11.3–14.5)

## 2007-08-21 ENCOUNTER — Encounter: Payer: Self-pay | Admitting: Internal Medicine

## 2007-08-21 LAB — COMPREHENSIVE METABOLIC PANEL
ALT: 11 U/L (ref 0–35)
Albumin: 4 g/dL (ref 3.5–5.2)
Alkaline Phosphatase: 55 U/L (ref 39–117)
CO2: 21 mEq/L (ref 19–32)
Potassium: 4.2 mEq/L (ref 3.5–5.3)
Sodium: 143 mEq/L (ref 135–145)
Total Bilirubin: 0.5 mg/dL (ref 0.3–1.2)
Total Protein: 6.5 g/dL (ref 6.0–8.3)

## 2007-08-21 LAB — CBC WITH DIFFERENTIAL/PLATELET
BASO%: 0.2 % (ref 0.0–2.0)
LYMPH%: 7.5 % — ABNORMAL LOW (ref 14.0–48.0)
MCHC: 34.3 g/dL (ref 32.0–36.0)
MONO#: 0.4 10*3/uL (ref 0.1–0.9)
NEUT#: 3.6 10*3/uL (ref 1.5–6.5)
Platelets: 253 10*3/uL (ref 145–400)
RBC: 3.11 10*6/uL — ABNORMAL LOW (ref 3.70–5.32)
RDW: 20.8 % — ABNORMAL HIGH (ref 11.3–14.5)
WBC: 4.4 10*3/uL (ref 3.9–10.0)

## 2007-08-21 LAB — MAGNESIUM: Magnesium: 1.8 mg/dL (ref 1.5–2.5)

## 2007-08-26 ENCOUNTER — Ambulatory Visit: Payer: Self-pay | Admitting: Internal Medicine

## 2007-08-28 LAB — COMPREHENSIVE METABOLIC PANEL
AST: 24 U/L (ref 0–37)
Alkaline Phosphatase: 63 U/L (ref 39–117)
BUN: 12 mg/dL (ref 6–23)
Glucose, Bld: 112 mg/dL — ABNORMAL HIGH (ref 70–99)
Sodium: 142 mEq/L (ref 135–145)
Total Bilirubin: 0.6 mg/dL (ref 0.3–1.2)

## 2007-08-28 LAB — CBC WITH DIFFERENTIAL/PLATELET
BASO%: 2.6 % — ABNORMAL HIGH (ref 0.0–2.0)
EOS%: 0.5 % (ref 0.0–7.0)
HCT: 31.7 % — ABNORMAL LOW (ref 34.8–46.6)
LYMPH%: 8 % — ABNORMAL LOW (ref 14.0–48.0)
MCH: 36.5 pg — ABNORMAL HIGH (ref 26.0–34.0)
MCHC: 35.7 g/dL (ref 32.0–36.0)
NEUT%: 69.6 % (ref 39.6–76.8)
Platelets: 160 10*3/uL (ref 145–400)

## 2007-09-02 ENCOUNTER — Ambulatory Visit: Payer: Self-pay | Admitting: Internal Medicine

## 2007-09-02 DIAGNOSIS — R5383 Other fatigue: Secondary | ICD-10-CM

## 2007-09-02 DIAGNOSIS — R5381 Other malaise: Secondary | ICD-10-CM | POA: Insufficient documentation

## 2007-09-02 LAB — CONVERTED CEMR LAB
ALT: 17 units/L (ref 0–35)
Albumin: 3.8 g/dL (ref 3.5–5.2)
Alkaline Phosphatase: 68 units/L (ref 39–117)
BUN: 14 mg/dL (ref 6–23)
Basophils Absolute: 0.1 10*3/uL (ref 0.0–0.1)
Basophils Relative: 2.1 % — ABNORMAL HIGH (ref 0.0–1.0)
Calcium: 9.5 mg/dL (ref 8.4–10.5)
Creatinine, Ser: 1.1 mg/dL (ref 0.4–1.2)
GFR calc Af Amer: 62 mL/min
Hemoglobin: 11.3 g/dL — ABNORMAL LOW (ref 12.0–15.0)
MCHC: 32.8 g/dL (ref 30.0–36.0)
Monocytes Absolute: 0.2 10*3/uL (ref 0.2–0.7)
Monocytes Relative: 7.2 % (ref 3.0–11.0)
Neutro Abs: 2.1 10*3/uL (ref 1.4–7.7)
Platelets: 170 10*3/uL (ref 150–400)
Potassium: 3.9 meq/L (ref 3.5–5.1)
RDW: 15 % — ABNORMAL HIGH (ref 11.5–14.6)

## 2007-09-04 LAB — CBC WITH DIFFERENTIAL/PLATELET
Basophils Absolute: 0 10*3/uL (ref 0.0–0.1)
Eosinophils Absolute: 0 10*3/uL (ref 0.0–0.5)
HCT: 27.9 % — ABNORMAL LOW (ref 34.8–46.6)
HGB: 10.2 g/dL — ABNORMAL LOW (ref 11.6–15.9)
LYMPH%: 20.7 % (ref 14.0–48.0)
MCHC: 36.6 g/dL — ABNORMAL HIGH (ref 32.0–36.0)
MONO#: 0.3 10*3/uL (ref 0.1–0.9)
NEUT%: 62 % (ref 39.6–76.8)
Platelets: 126 10*3/uL — ABNORMAL LOW (ref 145–400)
WBC: 2.3 10*3/uL — ABNORMAL LOW (ref 3.9–10.0)
lymph#: 0.5 10*3/uL — ABNORMAL LOW (ref 0.9–3.3)

## 2007-09-11 ENCOUNTER — Encounter: Payer: Self-pay | Admitting: Internal Medicine

## 2007-09-11 LAB — CBC WITH DIFFERENTIAL/PLATELET
BASO%: 2.1 % — ABNORMAL HIGH (ref 0.0–2.0)
Basophils Absolute: 0.1 10*3/uL (ref 0.0–0.1)
EOS%: 4.8 % (ref 0.0–7.0)
HCT: 31.1 % — ABNORMAL LOW (ref 34.8–46.6)
HGB: 10.8 g/dL — ABNORMAL LOW (ref 11.6–15.9)
LYMPH%: 19.7 % (ref 14.0–48.0)
MCH: 35.5 pg — ABNORMAL HIGH (ref 26.0–34.0)
MCHC: 34.9 g/dL (ref 32.0–36.0)
MCV: 102 fL — ABNORMAL HIGH (ref 81.0–101.0)
MONO%: 19.4 % — ABNORMAL HIGH (ref 0.0–13.0)
NEUT%: 54 % (ref 39.6–76.8)
Platelets: 190 10*3/uL (ref 145–400)
lymph#: 0.6 10*3/uL — ABNORMAL LOW (ref 0.9–3.3)

## 2007-09-11 LAB — COMPREHENSIVE METABOLIC PANEL
ALT: 10 U/L (ref 0–35)
AST: 22 U/L (ref 0–37)
BUN: 7 mg/dL (ref 6–23)
Calcium: 9.5 mg/dL (ref 8.4–10.5)
Chloride: 111 mEq/L (ref 96–112)
Creatinine, Ser: 0.99 mg/dL (ref 0.40–1.20)
Total Bilirubin: 0.3 mg/dL (ref 0.3–1.2)

## 2007-09-18 LAB — CBC WITH DIFFERENTIAL/PLATELET
BASO%: 0.3 % (ref 0.0–2.0)
EOS%: 2.3 % (ref 0.0–7.0)
HCT: 32.3 % — ABNORMAL LOW (ref 34.8–46.6)
LYMPH%: 16.1 % (ref 14.0–48.0)
MCH: 36.9 pg — ABNORMAL HIGH (ref 26.0–34.0)
MCHC: 35.2 g/dL (ref 32.0–36.0)
NEUT%: 60 % (ref 39.6–76.8)
Platelets: 227 10*3/uL (ref 145–400)
RBC: 3.08 10*6/uL — ABNORMAL LOW (ref 3.70–5.32)

## 2007-09-18 LAB — COMPREHENSIVE METABOLIC PANEL
ALT: 8 U/L (ref 0–35)
AST: 20 U/L (ref 0–37)
Alkaline Phosphatase: 71 U/L (ref 39–117)
Creatinine, Ser: 1.23 mg/dL — ABNORMAL HIGH (ref 0.40–1.20)
Sodium: 144 mEq/L (ref 135–145)
Total Bilirubin: 0.4 mg/dL (ref 0.3–1.2)
Total Protein: 6.4 g/dL (ref 6.0–8.3)

## 2007-09-23 ENCOUNTER — Ambulatory Visit: Payer: Self-pay | Admitting: Internal Medicine

## 2007-09-26 ENCOUNTER — Ambulatory Visit (HOSPITAL_COMMUNITY): Admission: RE | Admit: 2007-09-26 | Discharge: 2007-09-26 | Payer: Self-pay | Admitting: Gynecology

## 2007-09-26 ENCOUNTER — Ambulatory Visit: Admission: RE | Admit: 2007-09-26 | Discharge: 2007-09-26 | Payer: Self-pay | Admitting: Gynecology

## 2007-10-02 ENCOUNTER — Ambulatory Visit (HOSPITAL_COMMUNITY): Admission: RE | Admit: 2007-10-02 | Discharge: 2007-10-02 | Payer: Self-pay | Admitting: Internal Medicine

## 2007-10-02 ENCOUNTER — Encounter: Payer: Self-pay | Admitting: Internal Medicine

## 2007-10-02 LAB — CBC WITH DIFFERENTIAL/PLATELET
Basophils Absolute: 0 10*3/uL (ref 0.0–0.1)
EOS%: 1.3 % (ref 0.0–7.0)
Eosinophils Absolute: 0 10*3/uL (ref 0.0–0.5)
HGB: 10.7 g/dL — ABNORMAL LOW (ref 11.6–15.9)
NEUT#: 2.1 10*3/uL (ref 1.5–6.5)
RDW: 15.7 % — ABNORMAL HIGH (ref 11.3–14.5)
lymph#: 0.4 10*3/uL — ABNORMAL LOW (ref 0.9–3.3)

## 2007-10-02 LAB — COMPREHENSIVE METABOLIC PANEL
AST: 18 U/L (ref 0–37)
Albumin: 4.2 g/dL (ref 3.5–5.2)
BUN: 17 mg/dL (ref 6–23)
Calcium: 8.6 mg/dL (ref 8.4–10.5)
Chloride: 111 mEq/L (ref 96–112)
Potassium: 4.6 mEq/L (ref 3.5–5.3)
Total Protein: 6.7 g/dL (ref 6.0–8.3)

## 2007-10-06 LAB — PROTIME-INR
INR: 3.6 — ABNORMAL HIGH (ref 2.00–3.50)
Protime: 43.2 Seconds — ABNORMAL HIGH (ref 10.6–13.4)

## 2007-10-06 LAB — CBC WITH DIFFERENTIAL/PLATELET
BASO%: 1.8 % (ref 0.0–2.0)
Basophils Absolute: 0.1 10*3/uL (ref 0.0–0.1)
EOS%: 2.5 % (ref 0.0–7.0)
HGB: 10.7 g/dL — ABNORMAL LOW (ref 11.6–15.9)
MCH: 36.1 pg — ABNORMAL HIGH (ref 26.0–34.0)
MCHC: 33.2 g/dL (ref 32.0–36.0)
RBC: 2.95 10*6/uL — ABNORMAL LOW (ref 3.70–5.32)
RDW: 14.3 % (ref 11.3–14.5)
lymph#: 0.8 10*3/uL — ABNORMAL LOW (ref 0.9–3.3)

## 2007-10-09 ENCOUNTER — Ambulatory Visit: Payer: Self-pay | Admitting: Internal Medicine

## 2007-10-13 LAB — CBC WITH DIFFERENTIAL/PLATELET
BASO%: 0.7 % (ref 0.0–2.0)
EOS%: 1.2 % (ref 0.0–7.0)
MCH: 35.5 pg — ABNORMAL HIGH (ref 26.0–34.0)
MCHC: 33.9 g/dL (ref 32.0–36.0)
MCV: 105 fL — ABNORMAL HIGH (ref 81.0–101.0)
MONO%: 8.9 % (ref 0.0–13.0)
RBC: 3.18 10*6/uL — ABNORMAL LOW (ref 3.70–5.32)
RDW: 13.4 % (ref 11.3–14.5)
lymph#: 0.6 10*3/uL — ABNORMAL LOW (ref 0.9–3.3)

## 2007-10-13 LAB — PROTIME-INR: INR: 2.2 (ref 2.00–3.50)

## 2007-10-14 ENCOUNTER — Telehealth (INDEPENDENT_AMBULATORY_CARE_PROVIDER_SITE_OTHER): Payer: Self-pay | Admitting: *Deleted

## 2007-10-20 LAB — CBC WITH DIFFERENTIAL/PLATELET
BASO%: 1 % (ref 0.0–2.0)
Basophils Absolute: 0.1 10*3/uL (ref 0.0–0.1)
EOS%: 1.8 % (ref 0.0–7.0)
HCT: 31 % — ABNORMAL LOW (ref 34.8–46.6)
HGB: 10.7 g/dL — ABNORMAL LOW (ref 11.6–15.9)
LYMPH%: 7.1 % — ABNORMAL LOW (ref 14.0–48.0)
MCH: 36.2 pg — ABNORMAL HIGH (ref 26.0–34.0)
MCHC: 34.6 g/dL (ref 32.0–36.0)
MONO#: 0.6 10*3/uL (ref 0.1–0.9)
NEUT%: 83.1 % — ABNORMAL HIGH (ref 39.6–76.8)
Platelets: 197 10*3/uL (ref 145–400)
lymph#: 0.6 10*3/uL — ABNORMAL LOW (ref 0.9–3.3)

## 2007-10-20 LAB — PROTIME-INR

## 2007-10-21 ENCOUNTER — Ambulatory Visit: Payer: Self-pay | Admitting: Internal Medicine

## 2007-10-24 ENCOUNTER — Telehealth: Payer: Self-pay | Admitting: Internal Medicine

## 2007-10-27 ENCOUNTER — Telehealth (INDEPENDENT_AMBULATORY_CARE_PROVIDER_SITE_OTHER): Payer: Self-pay | Admitting: *Deleted

## 2007-10-28 ENCOUNTER — Telehealth (INDEPENDENT_AMBULATORY_CARE_PROVIDER_SITE_OTHER): Payer: Self-pay | Admitting: *Deleted

## 2007-10-30 LAB — CBC WITH DIFFERENTIAL/PLATELET
Basophils Absolute: 0.1 10*3/uL (ref 0.0–0.1)
Eosinophils Absolute: 0.2 10*3/uL (ref 0.0–0.5)
HGB: 10.5 g/dL — ABNORMAL LOW (ref 11.6–15.9)
LYMPH%: 6.4 % — ABNORMAL LOW (ref 14.0–48.0)
MCV: 105 fL — ABNORMAL HIGH (ref 81.0–101.0)
MONO%: 8.5 % (ref 0.0–13.0)
NEUT#: 5.5 10*3/uL (ref 1.5–6.5)
Platelets: 206 10*3/uL (ref 145–400)
RBC: 2.97 10*6/uL — ABNORMAL LOW (ref 3.70–5.32)

## 2007-11-04 ENCOUNTER — Telehealth: Payer: Self-pay | Admitting: Internal Medicine

## 2007-11-04 ENCOUNTER — Ambulatory Visit: Payer: Self-pay | Admitting: Internal Medicine

## 2007-11-06 ENCOUNTER — Telehealth (INDEPENDENT_AMBULATORY_CARE_PROVIDER_SITE_OTHER): Payer: Self-pay | Admitting: *Deleted

## 2007-11-06 LAB — PROTIME-INR: INR: 2.1 (ref 2.00–3.50)

## 2007-11-10 ENCOUNTER — Ambulatory Visit: Payer: Self-pay | Admitting: Internal Medicine

## 2007-11-10 DIAGNOSIS — B370 Candidal stomatitis: Secondary | ICD-10-CM

## 2007-11-10 LAB — CONVERTED CEMR LAB
BUN: 22 mg/dL (ref 6–23)
Basophils Relative: 1.7 % — ABNORMAL HIGH (ref 0.0–1.0)
Calcium: 10 mg/dL (ref 8.4–10.5)
Creatinine, Ser: 1.3 mg/dL — ABNORMAL HIGH (ref 0.4–1.2)
Eosinophils Absolute: 0.1 10*3/uL (ref 0.0–0.7)
Eosinophils Relative: 1.6 % (ref 0.0–5.0)
GFR calc Af Amer: 51 mL/min
GFR calc non Af Amer: 42 mL/min
HCT: 30.7 % — ABNORMAL LOW (ref 36.0–46.0)
MCHC: 33.3 g/dL (ref 30.0–36.0)
MCV: 103.2 fL — ABNORMAL HIGH (ref 78.0–100.0)
Monocytes Absolute: 0.8 10*3/uL (ref 0.1–1.0)
Neutro Abs: 6.6 10*3/uL (ref 1.4–7.7)
RBC: 2.97 M/uL — ABNORMAL LOW (ref 3.87–5.11)
WBC: 8.1 10*3/uL (ref 4.5–10.5)

## 2007-11-11 ENCOUNTER — Ambulatory Visit: Payer: Self-pay | Admitting: Cardiology

## 2007-11-12 ENCOUNTER — Ambulatory Visit: Admission: RE | Admit: 2007-11-12 | Discharge: 2007-12-28 | Payer: Self-pay | Admitting: Radiation Oncology

## 2007-11-13 LAB — PROTIME-INR: INR: 3.2 (ref 2.00–3.50)

## 2007-11-14 ENCOUNTER — Ambulatory Visit: Payer: Self-pay | Admitting: Internal Medicine

## 2007-11-14 DIAGNOSIS — R300 Dysuria: Secondary | ICD-10-CM

## 2007-11-18 LAB — CONVERTED CEMR LAB
Bilirubin Urine: NEGATIVE
Crystals: NEGATIVE
Specific Gravity, Urine: 1.02 (ref 1.000–1.03)
Urine Glucose: NEGATIVE mg/dL
Urobilinogen, UA: 0.2 (ref 0.0–1.0)

## 2007-11-24 ENCOUNTER — Ambulatory Visit: Payer: Self-pay | Admitting: Internal Medicine

## 2007-11-24 LAB — PROTIME-INR
INR: 2 (ref 2.00–3.50)
Protime: 24 Seconds — ABNORMAL HIGH (ref 10.6–13.4)

## 2007-12-02 ENCOUNTER — Ambulatory Visit (HOSPITAL_COMMUNITY): Admission: RE | Admit: 2007-12-02 | Discharge: 2007-12-02 | Payer: Self-pay | Admitting: Internal Medicine

## 2007-12-08 ENCOUNTER — Telehealth (INDEPENDENT_AMBULATORY_CARE_PROVIDER_SITE_OTHER): Payer: Self-pay | Admitting: *Deleted

## 2007-12-09 ENCOUNTER — Ambulatory Visit: Payer: Self-pay | Admitting: Pulmonary Disease

## 2007-12-09 ENCOUNTER — Encounter: Payer: Self-pay | Admitting: Internal Medicine

## 2007-12-10 ENCOUNTER — Encounter: Payer: Self-pay | Admitting: Internal Medicine

## 2007-12-10 ENCOUNTER — Telehealth (INDEPENDENT_AMBULATORY_CARE_PROVIDER_SITE_OTHER): Payer: Self-pay | Admitting: *Deleted

## 2007-12-11 ENCOUNTER — Telehealth (INDEPENDENT_AMBULATORY_CARE_PROVIDER_SITE_OTHER): Payer: Self-pay | Admitting: *Deleted

## 2007-12-12 ENCOUNTER — Telehealth: Payer: Self-pay | Admitting: Internal Medicine

## 2007-12-12 DIAGNOSIS — K5900 Constipation, unspecified: Secondary | ICD-10-CM | POA: Insufficient documentation

## 2007-12-12 DIAGNOSIS — R609 Edema, unspecified: Secondary | ICD-10-CM | POA: Insufficient documentation

## 2007-12-15 ENCOUNTER — Telehealth (INDEPENDENT_AMBULATORY_CARE_PROVIDER_SITE_OTHER): Payer: Self-pay | Admitting: *Deleted

## 2007-12-16 ENCOUNTER — Telehealth (INDEPENDENT_AMBULATORY_CARE_PROVIDER_SITE_OTHER): Payer: Self-pay | Admitting: *Deleted

## 2007-12-16 ENCOUNTER — Ambulatory Visit: Payer: Self-pay | Admitting: Internal Medicine

## 2007-12-17 ENCOUNTER — Telehealth (INDEPENDENT_AMBULATORY_CARE_PROVIDER_SITE_OTHER): Payer: Self-pay | Admitting: *Deleted

## 2007-12-23 ENCOUNTER — Telehealth: Payer: Self-pay | Admitting: Internal Medicine

## 2007-12-24 ENCOUNTER — Ambulatory Visit: Payer: Self-pay | Admitting: Internal Medicine

## 2007-12-25 ENCOUNTER — Ambulatory Visit: Payer: Self-pay | Admitting: Critical Care Medicine

## 2007-12-25 ENCOUNTER — Inpatient Hospital Stay (HOSPITAL_COMMUNITY): Admission: EM | Admit: 2007-12-25 | Discharge: 2007-12-30 | Payer: Self-pay | Admitting: Emergency Medicine

## 2007-12-30 ENCOUNTER — Encounter: Payer: Self-pay | Admitting: Internal Medicine

## 2008-01-28 DEATH — deceased

## 2008-09-20 IMAGING — CT CT ABDOMEN W/ CM
2 of 5 series · 16 of 46 positions shown, 18 images · IV contrast (agent unspecified)
Comparison: 10/02/2007

CT CHEST

CLINICAL DATA: Air

CT CHEST, ABDOMEN AND PELVIS WITH CONTRAST
TECHNIQUE: Multidetector CT imaging of the chest, abdomen and
pelvis was performed following the standard protocol during bolus
administration of intravenous contrast.
Contrast: 100 ml Xmnipaque-PGG

[Series 2: cap 5.0 b40f · axial · 0.62mm/px · z∈[-534,-4]mm · 13 of 122 slices shown, 15 images]
[im 8/122  soft-tissue]
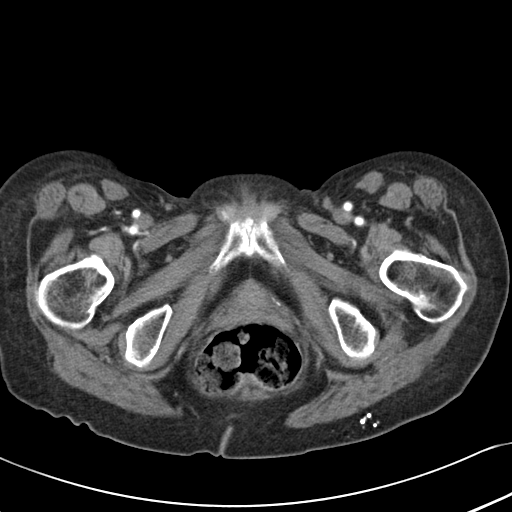
[im 8/122  bone]
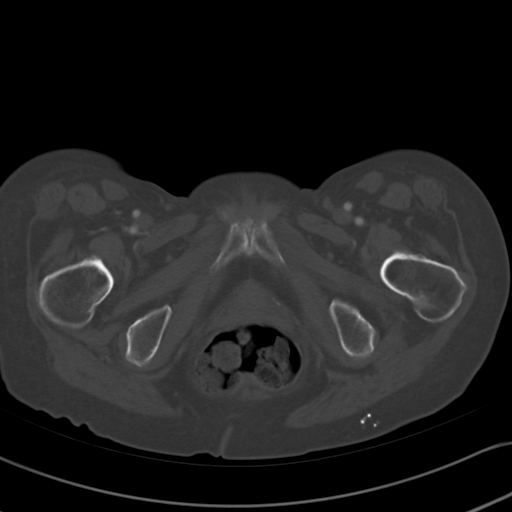
[im 15/122  soft-tissue]
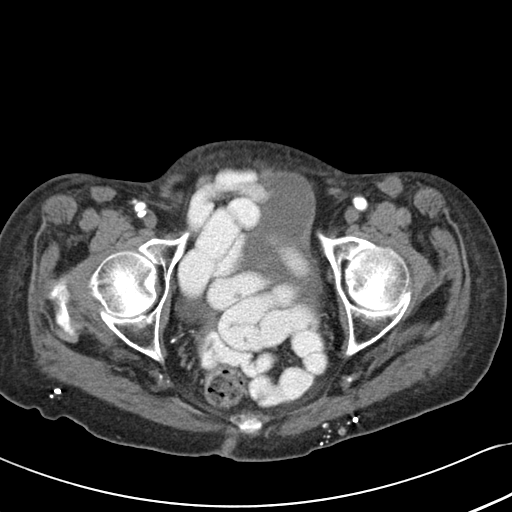
[im 29/122  soft-tissue]
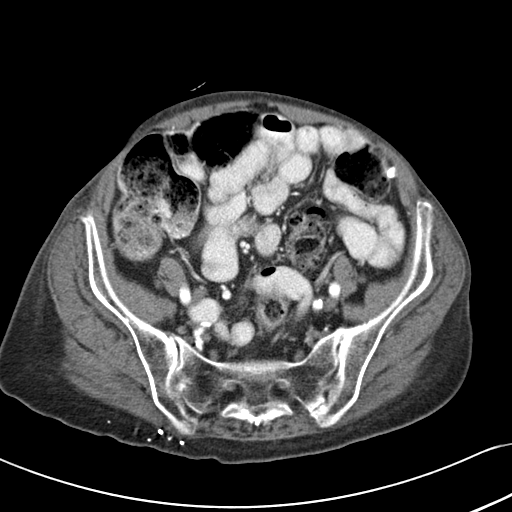
[im 36/122  soft-tissue]
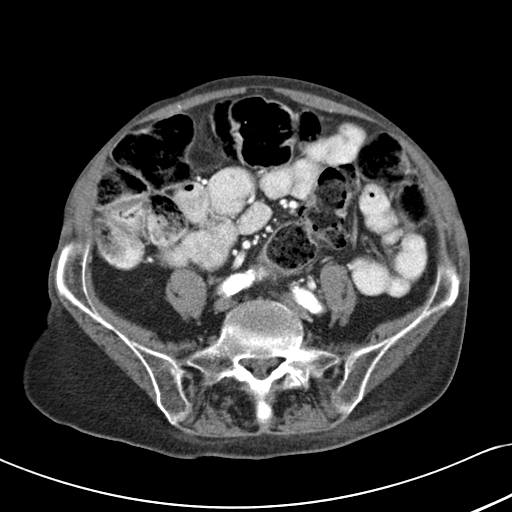
[im 43/122  soft-tissue]
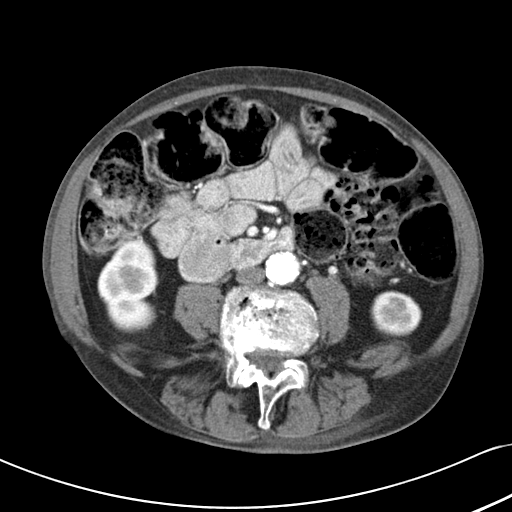
[im 50/122  soft-tissue]
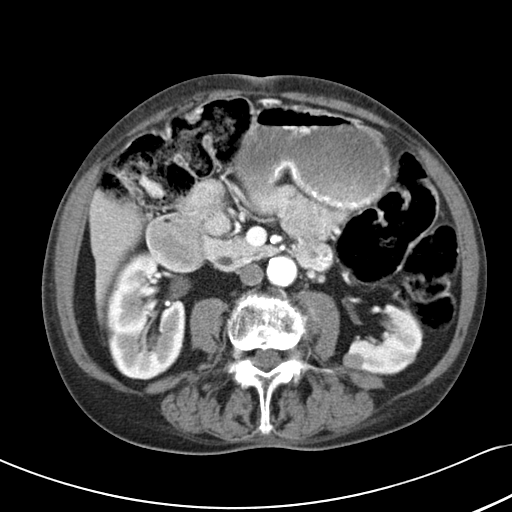
[im 65/122  soft-tissue]
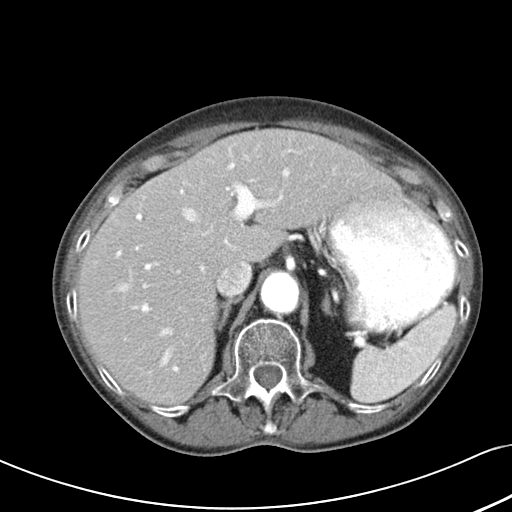
[im 72/122  soft-tissue]
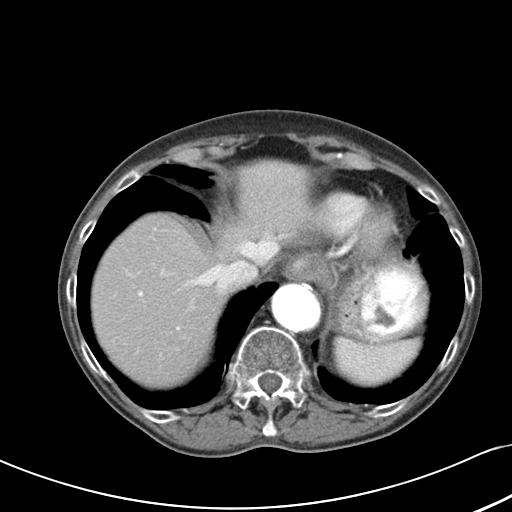
[im 79/122  soft-tissue]
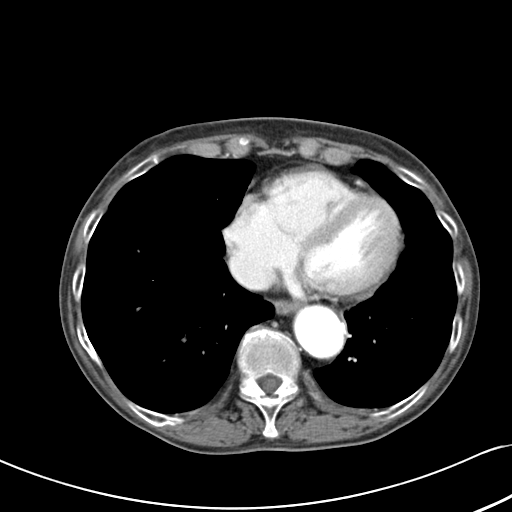
[im 79/122  bone]
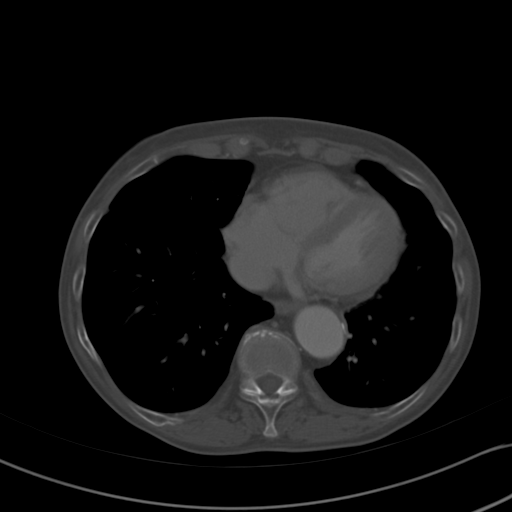
[im 86/122  soft-tissue]
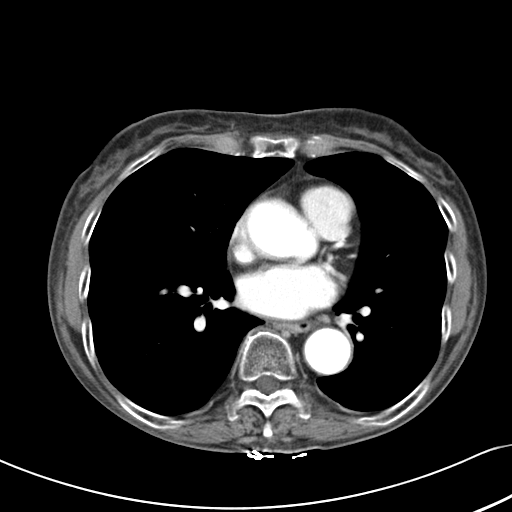
[im 93/122  soft-tissue]
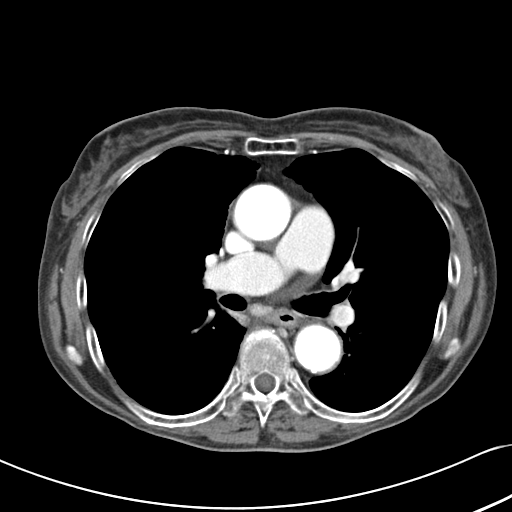
[im 107/122  soft-tissue]
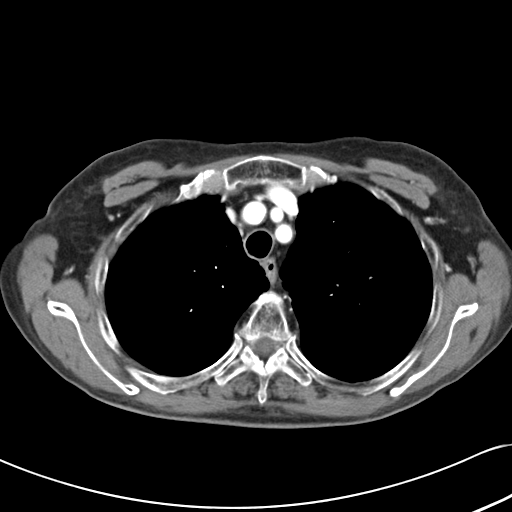
[im 114/122  soft-tissue]
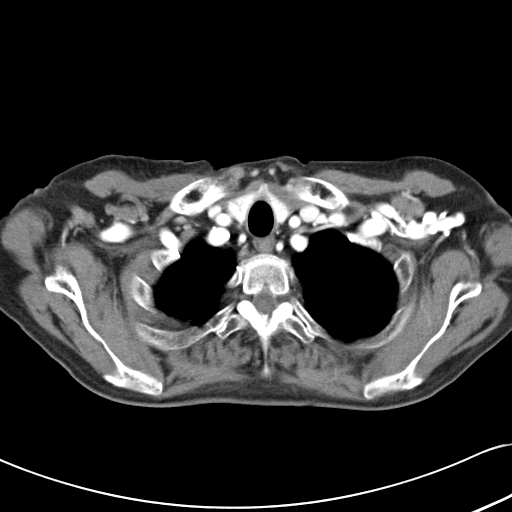

[Series 602: <mpr thick range> · coronal · 1.19mm/px · 3 of 77 slices shown]
[im 26/77  soft-tissue]
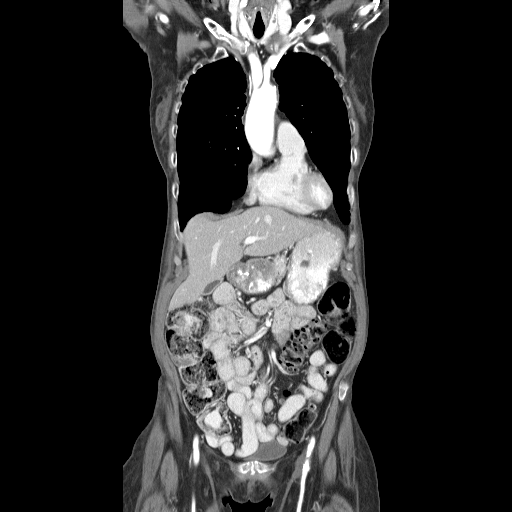
[im 34/77  soft-tissue]
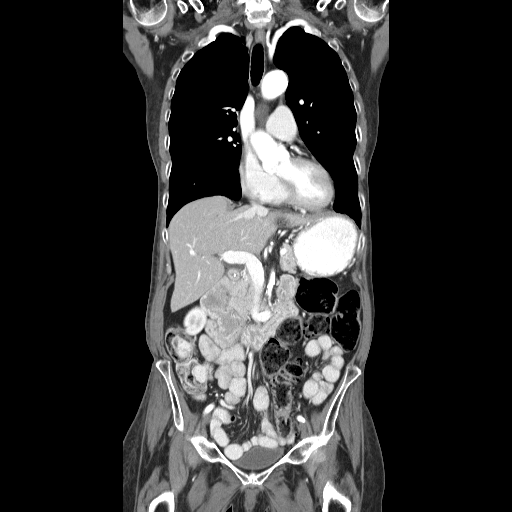
[im 43/77  soft-tissue]
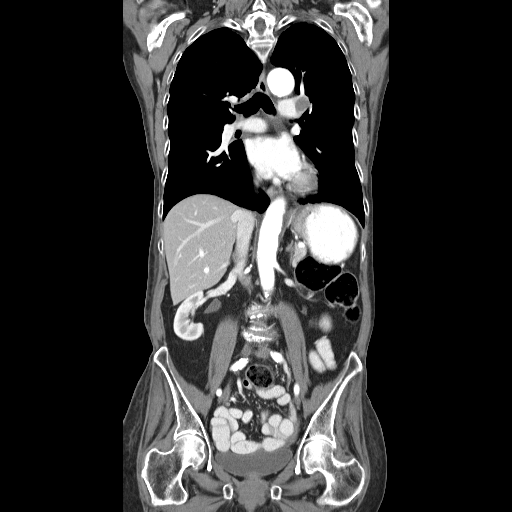

[16 of 46 positions shown; findings below may reference images not displayed]

FINDINGS: There is no axillary adenopathy.  Tiny nodule in the
left thyroid lobe is stable.  The left hilar lymphadenopathy has
progressed in the interval, measuring 1.7 x 2.7 cm today compared
to 1.4 x 2.2 cm previously, when measuring at the same level.
Small lymph node in the right hilum is not substantially changed in
the interval.  A small subcarinal lymph node is unchanged.  The
heart size is normal.  There is no pericardial or pleural effusion.

The acute pulmonary embolus seen in the interlobar pulmonary artery
previously is no longer evident.

Lung windows show advanced emphysema.  A 6 mm spiculated opacity in
the right lower lobe on image 41 is new in the interval.  Bone
windows show no suspicious sclerotic or lytic osseous lesions.
IMPRESSION: Interval increase in size of the left hilar lymphadenopathy.

New 6 mm spiculated opacity in the right lower lobe.  This will
require continued attention on follow-up imaging.

No filling defect is evident within the right interlobar pulmonary
artery on today's study.

CT ABDOMEN
FINDINGS: Two tiny hypodensities in the liver are stable in the
interval.  There is no focal abnormality in the spleen.  The wall
of the distal esophagus appears mildly thickened and ill-defined.
Stomach and duodenum are unremarkable.  No focal pancreatic
abnormality.  Left adrenal gland is normal.  The patient has a new
1.5 cm right adrenal nodule.  Stable tiny cyst is seen in the upper
pole of the left kidney. The 1 cm high density lesion in the medial
left kidney is stable.

There is no abdominal lymphadenopathy.  No intraperitoneal free
fluid.  A prominent amount of stool is seen in the abdominal
segments of the colon.  No evidence for bowel obstruction.
IMPRESSION: New 1.5 cm right adrenal nodule is suspicious for metastatic
involvement.

Stable 1 cm lesion in the left kidney with attenuation too high to
allow classification as a simple cyst.  Continued attention on
follow-up imaging is recommended.

CT PELVIS
FINDINGS: A complex lesion is again identified in the right adnexal
space.  This appears to have soft tissue and cystic components.
The lesion measures 5.2 x 3.7 cm today compared to 4.7 x 2.9 cm
previously.  No left adnexal mass.  The uterus is surgically
absent.

Bladder is unremarkable.  No intraperitoneal free fluid.  No pelvic
sidewall lymphadenopathy.  Diverticular changes seen in the sigmoid
colon without diverticulitis.  The terminal ileum is normal the
appendix is not visualized.

Bone windows show degenerative changes in the lower lumbar spine.
IMPRESSION: Interval increase in size of the complex cystic lesion in the right
adnexal space.  Cystic ovarian neoplasm is a concern.  This was
evaluated by ultrasound on 09/26/2007.  Close attention is
recommended.

## 2008-10-13 IMAGING — CR DG CHEST 2V
2 series · 2 of 2 positions shown · non-contrast
Comparison: CT 12/02/2007 and chest x-ray 09/02/2007

CLINICAL DATA: Cough lung cancer

CHEST - 2 VIEW

[w chest lat]
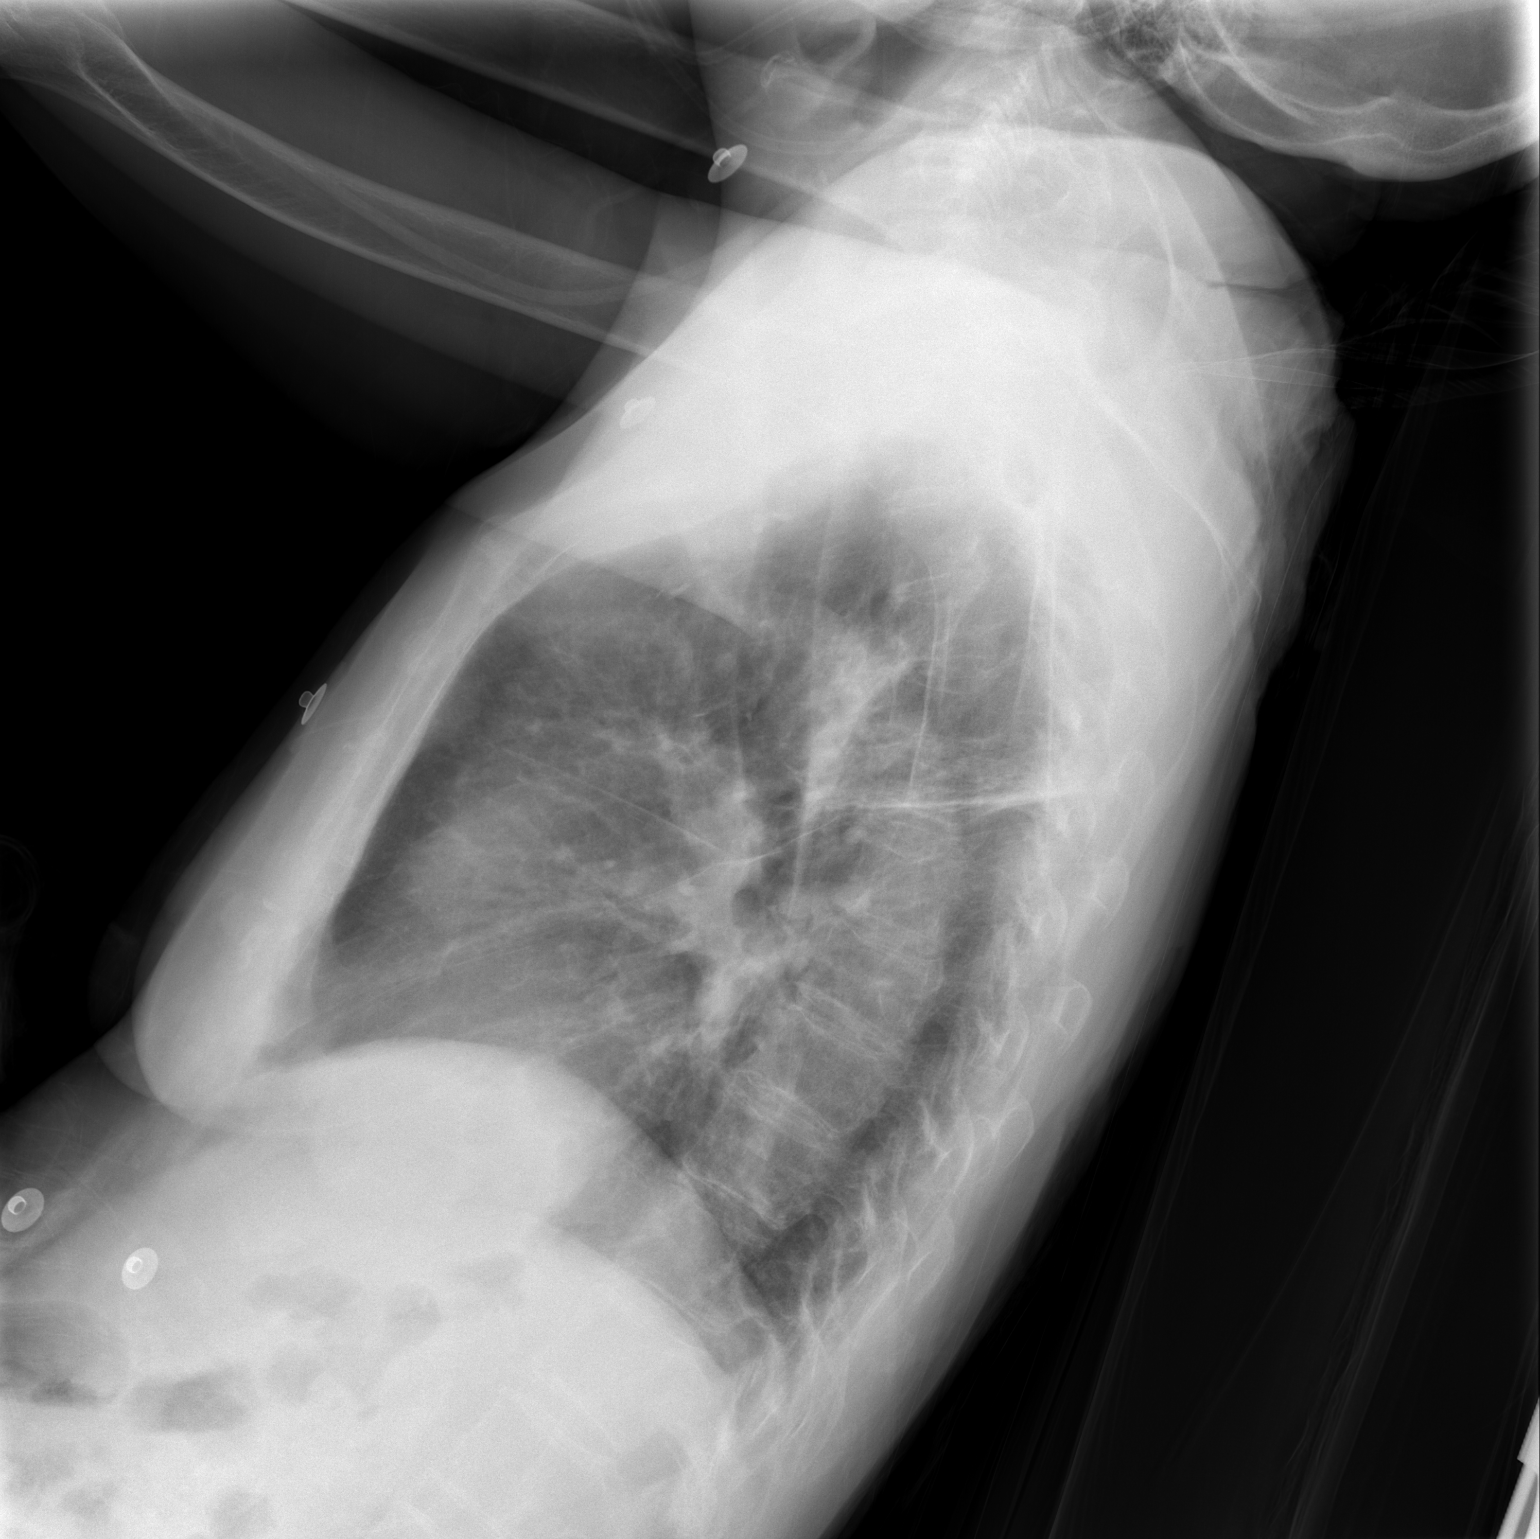

[view not recorded]
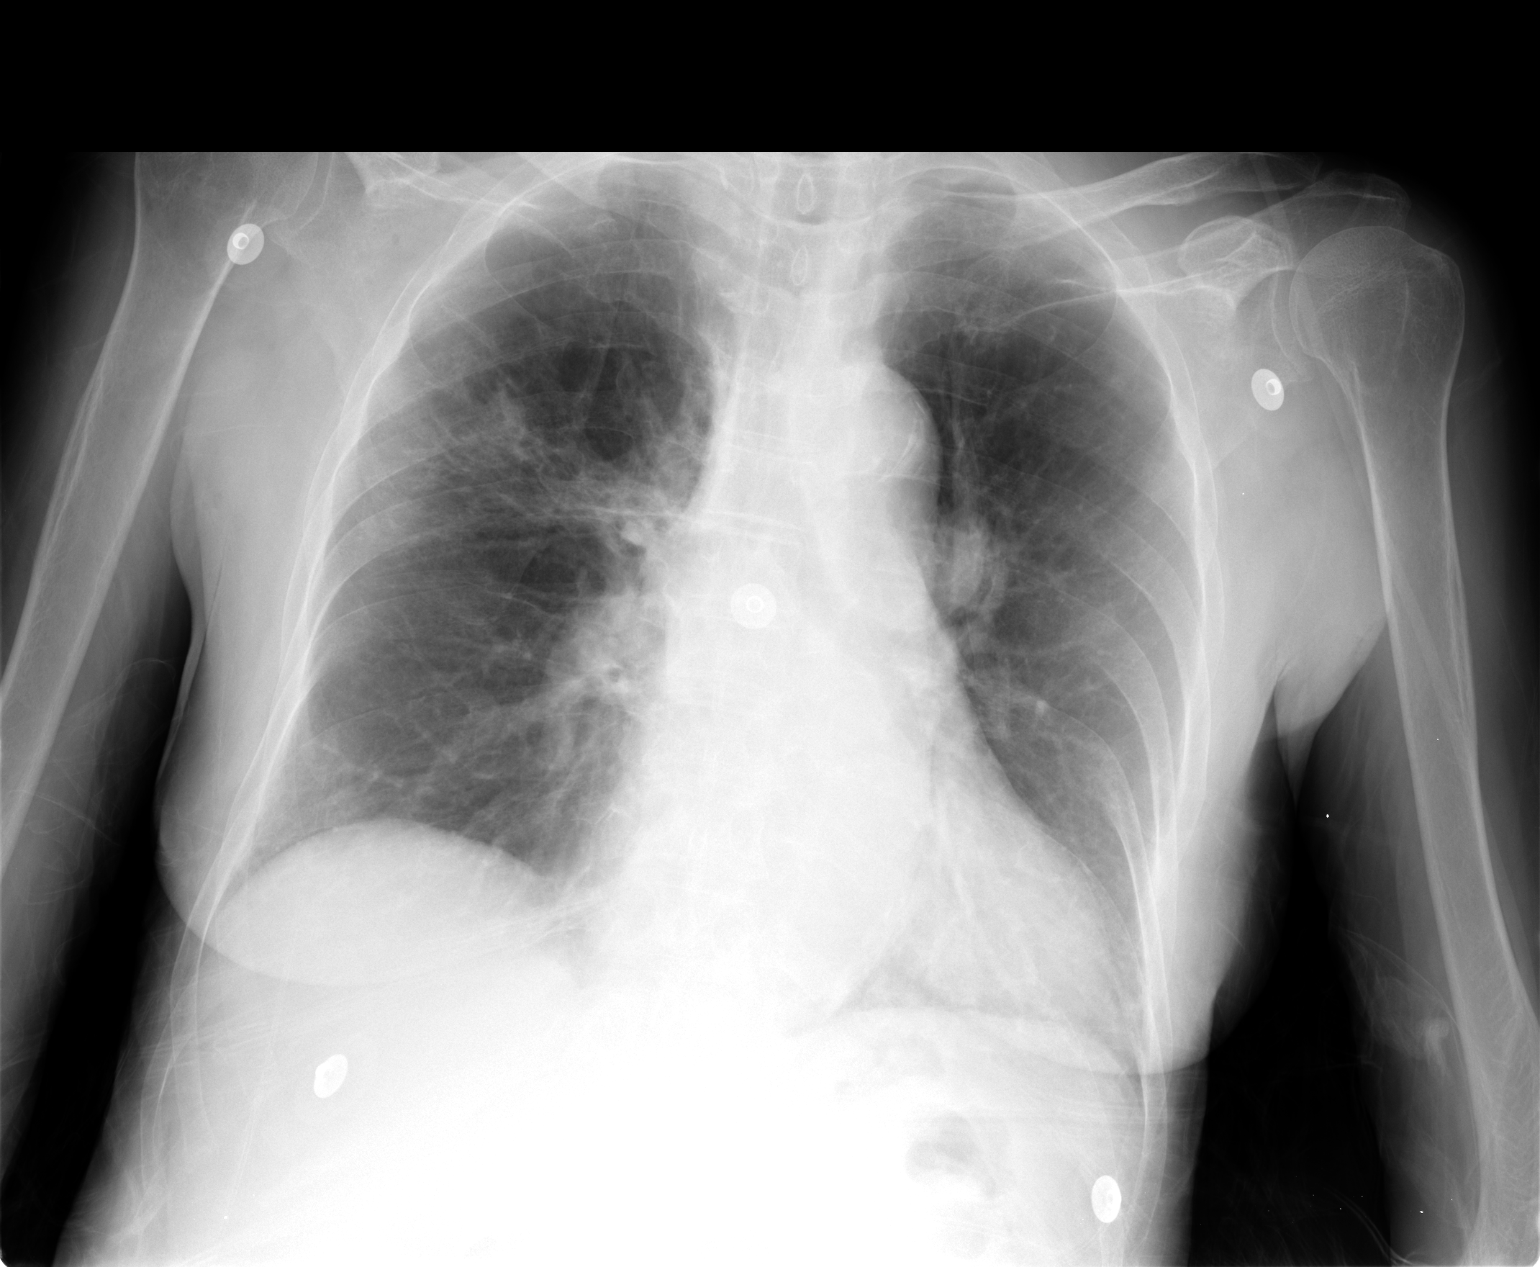

[2 of 2 positions shown; findings below may reference images not displayed]

FINDINGS: There are changes of COPD with pulmonary hyperinflation
and pulmonary scarring.  Left hilar adenopathy is better seen on
the recent CT scan but there is fullness of the left hilum.  There
is a new patchy right upper lobe density which was not on the
recent CT scan.  This may represent area of pneumonitis.  There is
no significant effusion.
IMPRESSION: Right upper lobe air space disease may represent pneumonia.
Continued follow-up is suggested in this patient with known lung
cancer..  There is COPD.

## 2010-08-20 ENCOUNTER — Encounter: Payer: Self-pay | Admitting: Internal Medicine

## 2010-12-12 NOTE — Consult Note (Signed)
NAME:  [name], BLUMBERG NO.:  [id number]   MEDICAL RECORD NO.:  [id number]          PATIENT TYPE:  OUT   LOCATION:  GYN                          FACILITY:  [facility]   PHYSICIAN:  De Blanch, M.D.DATE OF BIRTH:  July 25, 1929   DATE OF CONSULTATION:  09/26/2007  DATE OF DISCHARGE:                                 CONSULTATION   CHIEF COMPLAINT:  Adnexal mass.   A 77 year old African-American female seen in consultation at the  request of Dr. Shirline Frees regarding newly diagnosed adnexal mass.  The  mass was found at the time of obtaining a CT scan on July 15, 2007,  for restaging of recurrent lung cancer.  On CT scan, the mass measured  3.9 x 5.2 cm and was considered complex multicystic.  This mass was not  seen on a prior CT scan of May 2008.  The patient herself is entirely  asymptomatic.  Denies any pelvic pain, pressure vaginal bleeding or  discharge.  She has a past history of a vaginal hysterectomy for  menorrhagia and is not on any hormone replacement therapy.   PAST MEDICAL HISTORY:  1. The patient has recurrent small cell lung cancer under the care of      Dr. Si Gaul.  She has been treated with radiation therapy      as well as chemotherapy.  2. COPD.  3. Hypertension.  4. Osteoporosis.  5. Diverticulosis.  6. Cerebrovascular accident 1994 with minimal facial droop residual.  7. Hyperthyroidism.   OBSTETRICAL HISTORY:  Gravida 4.   FAMILY HISTORY:  Cardiovascular disease but no evidence of gynecologic,  colon, or breast cancer.   SOCIAL HISTORY:  The patient is married and comes accompanied by a  daughter today.   DRUG ALLERGIES:  PENICILLIN, SULFA and CODEINE.   CURRENT MEDICATIONS:  Actonel, aspirin, Astelin, Centrum Silver,  Combivent, Desyrel, potassium chloride, Lopressor, Mucinex, Nasonex and  Norvasc.   REVIEW OF SYSTEMS:  A 10-point comprehensive Review of Systems negative  except as noted above.   PHYSICAL  EXAMINATION:  VITAL SIGNS:  Weight 105 pounds, height 5 feet 3  inches.  Blood pressure 110/70, pulse 80.  GENERAL:  The patient is a healthy African-American female in no acute  distress.  HEENT:  Negative.  NECK:  Supple without thyromegaly.  There is no supraclavicular or  inguinal adenopathy.  ABDOMEN:  Soft, nontender.  No mass, organomegaly, ascites or hernias  noted.  PELVIC:  EG/BUS, vagina, urethra are normal.  Cervix and uterus are  surgically absent. Adnexa without clear evidence of a mass.  Rectovaginal exam confirms.   IMPRESSION:  Relatively small cystic mass detected on CT scan.  The  patient is asymptomatic.  I believe this most likely is benign but would  prefer to have further morphologic characterization by obtaining an  ultrasound.  We will order the ultrasound and base any further  recommendations on those findings.  I will not obtain a CA-125 as, given  the patient's lung cancer, I believe it could be elevated falsely.  Once  we have the ultrasound, we will make further recommendations,  but it is  certainly possible that we could follow this patient rather than take  her to surgery.      De Blanch, M.D.  Electronically Signed     DC/MEDQ  D:  09/26/2007  T:  09/27/2007  Job:  [id number]   cc:   Telford Nab, R.N.  [address]  [place], [place] [address]   Charlaine Dalton. Sherene Sires, MD, FCCP  [address]  [place] [address]   Lajuana Matte, MD  Fax: [phone]   Durene Romans. Lestine Box, MD  Fax: [phone]

## 2010-12-12 NOTE — Assessment & Plan Note (Signed)
[name]                             PULMONARY OFFICE NOTE   NAME:JONESRosamond, [name]                      MRN:          [id number]  DATE:05/07/2007                            DOB:          1929/07/23    PULMONARY EXTENDED OFFICE VISIT:   HISTORY:  This is a 77 year old black female who came in for advice  regarding various chemotherapy options.  She history small cell lung  cancer with 50% obstruction of the right upper lobe documented December  2007, status post completion of radiation and chemotherapy in February  2008 and now tells me that she has been diagnosed with recurrent disease  and is being offered standard therapy versus an investigational agent  and not sure for what she wants to sign up.   Presently, she complains of fatigue.  She is much better overall since  we started her on prednisone on January 03, 2007 empirically for both the  asthmatic component to her COPD and for profound fatigue and failure to  thrive.  She has been able to taper this down to 20-mg tablets, 1/2  daily without any recurrence of her previous symptoms, except she does  feel tireder now.   For full inventory of medication, please see face sheet dated May 07, 2007.   I reviewed her medications with her using the medication calendar format  and column dated May 07, 2007.   PHYSICAL EXAMINATION:  She is a somewhat depressed-appearing, thin,  ambulatory black female in no acute distress.  Her weight is 103 pounds, which is up a pound from previous visit.  Blood pressure 140/80.  HEENT:  Unremarkable.  Oropharynx is clear.  LUNGS:  Fields a few expiratory rhonchi, right greater than left.  There  is no true wheeze.  CARDIAC:  Regular rate and rhythm without murmur, gallop or rub.  ABDOMEN:  Soft and benign.  EXTREMITIES:  Warm without Caucasian female, cyanosis, clubbing or  edema.   IMPRESSION:  Chronic obstructive pulmonary disease with an asthmatic  component, maintained on Advair and daily prednisone.  I recommended  that she try every-other-day dosing to see if she notices any  difference.  If so, she can certainly continue the 20 mg one-half daily,  especially based on her limited life expectancy in terms of reducing the  likelihood of long-term side-effects (I did not dwell on this issue,  however, today).   We spent most of the time discussing the philosophy of desperate times  require desperate measures in terms of not focusing too much on the  side-effects of the medications, but understanding that if she has  recurrent small cell cancer, her life expectancy is quite short  without more aggressive therapy.  She and her daughter seem to  understand this better by the time she left, although she did make a  final decision.     Charlaine Dalton. [name] Sires, MD, [facility]  Electronically Signed    MBW/MedQ  DD: 05/07/2007  DT: 05/08/2007  Job #: [id number]   cc:   Lajuana Matte, MD

## 2010-12-12 NOTE — Consult Note (Signed)
NAMEMCKENIZE, MEZERA               ACCOUNT NO.:  [id number]   MEDICAL RECORD NO.:  [id number]          PATIENT TYPE:  INP   LOCATION:  1338                         FACILITY:  [facility]   PHYSICIAN:  Wilson Singer, M.D.DATE OF BIRTH:  [birth year]   DATE OF CONSULTATION:  12/26/2007  DATE OF DISCHARGE:                                 CONSULTATION   REQUESTING PHYSICIAN:  Charlaine Dalton. Sherene Sires, MD, FCCP.   IMPRESSION:  1. Altered mental status secondary to brain metastasis from stage IV      small cell lung cancer.  No further chemotherapy or radiotherapy to      be entertained.  2. Palliative Performance Score is only 10%.   RECOMMENDATIONS:  1. DNR already established.  2. Family wished to continue intravenous fluids, steroids, and      antibiotics for the time being but also continue intravenous      morphine drip for comfort measures.  The family clearly understands      the prognosis here is very poor but would like to see if the      intravenous antibiotics and fluids help in any way.  3. Survival likely only a few days to a week or so.   HISTORY:  This unfortunate 77 year old lady who is diagnosed with small  cell lung cancer approximately 15 months ago now presents with altered  mental status and pain in her legs.  She has been started on a morphine  drip and is currently at 5 mg/hr for pain relief.  At the present time,  she is unable to give me a history and is unresponsive.  Prior to this  illness, she was ambulant, although required help in getting up from a  bed.  She was talking, communicating, and feeding herself; however, in  the last week or so, she has taken a turn for the worse with altered  mental status and pain, as described above.  We are now asked to consult  in terms of goals of care and symptom management.   PAST MEDICAL HISTORY:  1. Small cell lung cancer with brain metastasis, despite two rounds of      chemotherapy.  2. COPD.  3. Hypertension.  4.  Osteoporosis.  5. CVA in 1994.  6. Hyperthyroidism.   ALLERGIES:  PENICILLIN, SULFA, CODEINE, CAPTOPRIL.   MEDICATIONS CURRENTLY IN THE HOSPITAL:  1. Methylprednisolone 20 mg IV every 12 hours.  2. Ventolin and Atrovent nebulizer every 6 hours.  3. Protonix 40 mg IV once daily.  4. Intravenous fluids with D5-half normal saline at 50 cc/hr.  5. Avelox 400 mg IV q.24h.   SOCIAL HISTORY:  The patient lived with one of her children.  There is a  history of tobacco abuse.   REVIEW OF SYSTEMS:  Patient is unable to give me any clear history at  the present time.   PHYSICAL EXAMINATION:  She is afebrile, although she did spike a fever  earlier and appears to be hemodynamically stable.  She is unarousable to  voice.  Heart sounds are present and normal.  Lung fields show bilateral  crackles.   RELEVANT DATA:  A CT head scan done on November 14, 2007 shows multiple  cerebellar metastasis, some of which are quite large.   IMPRESSION/DISCUSSION:  This unfortunate 77 year old lady's prognosis is  extremely poor and survival is only likely a few more days.  The family,  especially one of the daughters, still feels that given intravenous  fluids and antibiotics to see if she is improved, would be of value.  We  will abide by this for the time being.  The family does realize that if  she does deteriorate, then all aggressive care should be discontinued  and aggressive comfort measures undertaken.  However, at this point, she  is also having aggressive comfort measures with intravenous morphine  drip, which seems to be keeping her leg pain under good control.  I feel  that the survival is going to be a few days, as mentioned above.   Total time spent was 110 minutes, more than 50% of which was involved in  counseling and coordination of care.      Wilson Singer, M.D.  Electronically Signed     NCG/MEDQ  D:  12/26/2007  T:  12/26/2007  Job:  [id number]

## 2010-12-12 NOTE — Assessment & Plan Note (Signed)
[facility]                             PULMONARY OFFICE NOTE   NAME:[name], [name]                      MRN:          [id number]  DATE:01/03/2007                            DOB:          1928-10-04    PRIMARY SERVICE EXTENDED OFFICE VISIT:  A 77 year old black female with  hypertension and COPD with an asthmatic component, who was actively  smoking until December of 2007, when she was diagnosed with a  bronchogenic carcinoma, small-cell in nature, 50% obstruction of the  right upper lobe.  She finished radiation and chemotherapy approximately  in February of 2008 and has had recurrence for which she has been  considered for additional radiation and chemotherapy by Dr. Arbutus Ped and  Dr. Lestine Box.   In the meantime, she comes in with increasing symptoms over the last  several weeks of coughing, dyspnea and chest congestion with thick,  white mucus and subjective wheezing for which she already has p.r.n.  albuterol and Mucinex listed on the med sheet, but is not consistent  about taking them as directed.  She denies any pleuritic pain or  purulent sputum or history of hemoptysis, fevers, chills, sweats.  She  does have poor appetite, and reports she was much better after a round  of prednisone, earlier, several months ago.   PHYSICAL EXAMINATION:  She is an anxious, chronically ill, thin, black  female, in no acute distress.  She had normal vital signs with a blood  pressure of 140/90 and pulse rate of 84.  HEENT:  Unremarkable.  Oropharynx clear.  Dentition is adequate.  NECK:  Supple without cervical adenopathy or tenderness.  Trachea is  midline.  No thyromegaly.  LUNG FIELDS:  Revealed junky rhonchi, right greater than left.  Overall  air movement is adequate.  There is a regular rhythm without murmur, gallop or rub.  ABDOMEN:  Soft, benign.  EXTREMITIES:  Without calf tenderness, cyanosis, clubbing or edema.   IMPRESSION:  1. COPD with an  asthmatic bronchitic component, status post smoking      cessation in December of 2007 with poor mucociliary function and      tendency to airways inflammation, despite being maintained on      Advair 250/50 b.i.d..  First, I recommended decreasing Toprol down      to one-half daily and increasing Norvasc up to 10 mg daily to      reduce the beta blocker effect, potentially interfering with      Advair.  Secondly, I made sure she could use albuterol effectively      and emphasized this should be taken every 4 hours p.r.n.  Because      she had such a convincing response to prednisone previously, I have      asked her to start on 20 mg per day and stay on it for the next      month.  We will see to what extent this improves her breathing and      also her appetite and overall sense of well-being, which at this      point, are  quite poor.  If her appetite remains poor, I understand      Dr. Arbutus Ped is considering placing her on Megace, which is fine      with me, but I would try the prednisone first.  2. Hypertension is adequately controlled, but see above changes      recommended for Toprol and Norvasc.  3. She asked me about, Would an antibiotic work?  I emphasized to      her that, unless she is having purulent sputum, there is no role      for an antibiotic, but instead she should at least follow the      p.r.n. medicines that are written.  This led to a discussion, in      general, of her maintenance, versus p.r.n. medicines, which are now      reflected in a very user-friendly and well-organized calendar, and      I discussed this issue with her daughter, as well.  Namely, she      needs to follow the instructions that she has been given first and,      if these do not work, then we could certainly try something      different.  4. Finally, she mentioned that Dr. Lestine Box had given her Darvocet for      pain.  I placed this on the p.r.n. list, to be taken every four      hours.    She will follow up with me every four weeks.     Charlaine Dalton. Sherene Sires, MD, [facility]  Electronically Signed    MBW/MedQ  DD: 01/03/2007  DT: 01/03/2007  Job #: [id number]   cc:   Lajuana Matte, MD  Durene Romans. Lestine Box, MD

## 2010-12-12 NOTE — Assessment & Plan Note (Signed)
[facility]                             PULMONARY OFFICE NOTE   NAME:[name], [name]                      MRN:          [id number]  DATE:02/19/2007                            DOB:          1928/12/26    PRIMARY SERVICE FOLLOWUP OFFICE VISIT   HISTORY:  A 77 year old black female, former smoker, with COPD and small  cell lung cancer status post completion of chemotherapy and undergoing a  CT scan today for this. She comes back all smiles today having been  started on prednisone at 20 mg per day when I saw her on June 6. She has  also reduced Toprol down to 25 mg tablets one-half daily and increased  Norvasc up to 10 mg daily because of concern that the Toprol might be  causing spillover beta-blockade and interfering with Advair her  maintenance beta-agonist.   She reports she rarely needs albuterol now and has also noticed some  marked improvement in appetite and energy since starting prednisone. She  denies any purulent sputum, throat pain, fevers, chills, sweats, PND or  leg swelling.   For full inventory of medications, please see face sheet dated February 19, 2007; correct as listed.   PHYSICAL EXAMINATION:  She is a pleasant ambulatory black female in no  acute distress. She has stable vital signs.  HEENT: Is unremarkable. Oropharynx is clear.  LUNGS: Lung fields reveal diminished breath sounds without wheezing.  HEART: Regular rate and rhythm without murmur, gallop or rub.  ABDOMEN: Soft, benign.  EXTREMITIES: Warm without calf tenderness, cyanosis, clubbing or edema.   IMPRESSION:  This patient has had a dramatic turn around both in terms  of pulmonary symptoms and also overall strength, energy and appetite  since starting prednisone at 20 mg daily. I have recommended therefore  that she reduce the dose to 10 mg per day and absolutely maintain  smoking abstinence.   Followup. I will see the patient back in four to six weeks to continue  tapering the prednisone down and emphasized that she stay on the same  maintenance and p.r.n. regimen that is reflected on the medication  calendar that she carries with her which is both unambiguous and  extremely user friendly.     Charlaine Dalton. Sherene Sires, MD, [facility]  Electronically Signed    MBW/MedQ  DD: 02/19/2007  DT: 02/19/2007  Job #: [id number]   cc:   Lajuana Matte, MD

## 2010-12-15 NOTE — Assessment & Plan Note (Signed)
[facility]                               PULMONARY OFFICE NOTE   NAME:[name], [name]                      MRN:          [id number]  DATE:05/14/2006                            DOB:          28-Dec-1928    This is a 77 year old black female, active smoking, complaining of wheezing  when gets up in the morning despite taking Advair 250 b.i.d.  Unfortunately,  she has resumed active smoking after intermittently quitting over the last 3  months.  She returns today to have her blood pressure rechecked and request  a prescription for Chantix.  She denies any TIA or claudication symptoms,  increased dyspnea, daytime or premature awakening due to wheezing,  orthopnea, PND or leg swelling, purulent sputum or chest pain.   MEDICATIONS:  Please face sheet dated May 14, 2006.   PHYSICAL EXAMINATION:  GENERAL:  This is a chronically ill-appearing,  ambulatory black female, in no acute distress.  Appears approximately stated  stage.  HEENT:  Unremarkable.  Oropharynx clear.  LUNG FIELDS:  Reveal diminished breath sounds with trace rhonchi only.  __________  HEART:  Regular rhythm.  No murmur, gallop or rub.  ABDOMEN:  Soft and non-distended.  EXTREMITIES:  No calf tenderness.   IMPRESSION:  1. Chronic obstructive pulmonary disease with an asthmatic component that      is reasonably controlled with Advair, and would most likely be      eliminated if she stopped the smoking.  In the meantime, she has p.r.n.      Albuterol to use, and I have discussed with her at length smoking      cessation issues today.  I have given her a prescription for Chantix      starter pack and asked her to see me back in 1 month for the followup      prescription is she is able to tolerate it and stop smoking.      Otherwise, I have referred her to back to the [employer].      Chart review indicates she is due for a comprehensive healthcare      evaluation, which is due  for December 2007.  We will see her in the      meantime for the Chantix prescription if she is able to maintain the      medicine.  2. Hypertension.  Adequately controlled on present regimen.  See recent      medication changes reflective on the medication sheet.            ______________________________  Charlaine Dalton. Sherene Sires, MD, [facility]     MBW/MedQ  DD:  05/14/2006  DT:  05/15/2006  Job #:  [id number]

## 2010-12-15 NOTE — Assessment & Plan Note (Signed)
[facility]                             PULMONARY OFFICE NOTE   NAME:[name] [name] HAECKER                      MRN:          [id number]  DATE:11/26/2006                            DOB:          07/01/1929    HISTORY OF PRESENT ILLNESS:  The patient is a 77 year old African  American female, patient of Dr. Thurston Hole who has a known history of COPD  with an asthmatic bronchitic component.  The patient also has a history  of non-small cell carcinoma obstructing the right upper lobe.  Now  status post chemotherapy and radiation.  The patient returns today for a  1 week followup to review medications.  The patient has had multiple  changes to her medication regimen and needs a new updated list.  At last  visit, the patient had been having some difficulty with increased cough  and congestion and shortness of breath last visit.  The patient's chest  x-ray shows COPD changes with a decreased fullness in the right hilum.  The patient was placed on a prednisone taper and recommended to add in  Mucinex DM to her regimen.  The patient returns today much improved with  decreased cough and congestion.  The patient denies any purulent sputum,  fever, hemoptysis, orthopnea, PND or leg swelling.   PAST MEDICAL HISTORY:  Is reviewed.   CURRENT MEDICATIONS:  Is reviewed.   PHYSICAL EXAMINATION:  The patient is a thin black female in no acute  distress.  She is afebrile with stable vital signs. O2 saturation is 98% on room  air.  HEENT:  Is unremarkable.  NECK:  Is supple without cervical adenopathy. No JVD.  LUNGS:  Sounds revealed diminished breath sounds in the basis with a few  scattered rhonchi.  No wheezing is noted.  CARDIAC:  Regular rate.  ABDOMEN:  Is soft and nontender.  EXTREMITIES:  Are warm without any calf cyanosis, clubbing or edema.   IMPRESSION AND PLAN:  1. Chronic obstructive pulmonary disease and asthmatic bronchitis with      a recent flare  following radiation and chemotherapy for non-small      cell lung carcinoma.  The patient seems to be improved after      steroid burst. The patient will continue on Mucinex DM twice daily      as needed for cough and congestion.  Will follow back up with Dr.      Sherene [name] in 1 month or sooner if needed.  2. Complex medication regimen.  Patient's medication reviewed in      detail. Patient education      was provided.  Patient's computerized medication calendar was      adjusted accordingly and reviewed with patient.  The patient is      aware to bring it back to each and every visit.      [name] Oaks, NP  Electronically Signed      [name] [name]. [name] Sires, MD, [facility]  Electronically Signed   TP/MedQ  DD: 11/26/2006  DT: 11/26/2006  Job #: [id number]

## 2010-12-15 NOTE — Discharge Summary (Signed)
NAME:  [name], [name]                         ACCOUNT NO.:  [id number]   MEDICAL RECORD NO.:  [id number]                   PATIENT TYPE:  INP   LOCATION:  2011                                 FACILITY:  [facility]   PHYSICIAN:  Charlaine Dalton. Sherene Sires, M.D. [facility]           DATE OF BIRTH:  Jan 27, 1929   DATE OF ADMISSION:  12/08/2002  DATE OF DISCHARGE:  12/11/2002                                 DISCHARGE SUMMARY   FINAL DIAGNOSES:  1. Acute chest pain, probably musculoskeletal in nature.  2. Chronic obstructive pulmonary disease with an acute asthmatic bronchitic     exacerbation.  3. Hypertension.  4. Suspected reflux.   HISTORY OF PRESENT ILLNESS:  Please see dictated history and physical  report.  This is a 77 year old black female active smoker with a three day  history of nonspecific malaise associated with an acute left sided chest  discomfort, worse on deep inspiration and associated with a chronic hacking  cough with pain worse when coughing.  However, she denied any change in her  chronic sputum which is thick and difficult to cough up.  No associated  fever, sweats, orthopnea or paroxysmal nocturnal dyspnea.   HOSPITAL COURSE:  The patient's initial work up did not suggest any cardiac  cause for the pain and a CT scan did not suggest any evidence of PE or  pneumonia.   She was treated, therefore, with nonsteroidal's and we treated her chronic  obstructive pulmonary disease with a combination of bronchodilators and  intravenous steroids, also enforced smoking cessation.   Her pain subsided over the first 24 hours and she did have rhonchi on  examination that had almost completely resolved with minimal residual  congested cough.   At the time of discharge she is ambulatory with no chest pain and is  adequate saturating on room air.  Her laboratory studies are all  unremarkable.   The patient has a poor social situation.  Apparently her son has just been  incarcerated on  drug-related issues.   DISPOSITION:  The patient will therefore be discharged home on a low salt  diet.  Case management will see her regarding her problems with family  dynamics before discharge.  She is to see Dr. Sherene Sires in one week for follow  up.   DISCHARGE MEDICATIONS:  1. Avalox to be taken 400 mg daily for four more days empirically.  (No     cultures obtained.)  2. Toprol 50 mg daily.  3. Nasonex 2 puffs each morning.  4. Astelin 2 puffs q.h.s.  5. Prilosec 20 mg before breakfast.  6. Ecotrin 325 mg q.a.m.  7. Norvasc 5 mg q.a.m.  8. NicoDerm patch 14 mg per day, change patch every day.  9. For pain the patient can use Vioxx 25 mg daily PRN.  10.      For cough or congestion she can use guaifenesin-DM, two b.i.d.  11.  I have also started the patient on new prescriptions for Wellbutrin     100 mg daily to increase to twice daily when she is seen in the office     and also:  12.      Advair 250/50 one b.i.d.   Social services will see the patient before discharge.                                               Charlaine Dalton. Sherene Sires, M.D. [facility]    MBW/MEDQ  D:  12/11/2002  T:  12/12/2002  Job:  [id number]

## 2010-12-15 NOTE — Assessment & Plan Note (Signed)
[facility]                             PULMONARY OFFICE NOTE   NAME:[name], [name]                      MRN:          [id number]  DATE:06/27/2006                            DOB:          [birth year]    A 77 year old black female long-term smoker with COPD maintained on  Advair at 250/50 b.i.d. plus p.r.n. albuterol. She had an attack of  wheezing after running out of Proventil and ended up in the one of the  urgent care centers with chest x-ray suggesting a right hilar mass. She  underwent a CT scan which confirmed a hilar mass, but now it is back to  baseline with no significant dyspnea, no unusual dyspnea. She continues  to have mild congestive cough in the morning with slightly yellow sputum  but no hemoptysis, pleuritic pain, fevers, chills, sweats, orthopnea,  PND or unintended weight loss.   PHYSICAL EXAMINATION:  VITAL SIGNS:  Her weight continues to be around  93 pounds which has been stable for the last year.  HEENT:  Unremarkable. Oropharynx clear.  LUNGS:  Lung fields reveal diminished breath sounds, but there is no  localized generalized wheezing or rhonchi.  HEART:  Regular rhythm without murmurs, gallops or rub.  ABDOMEN:  Soft, benign.  EXTREMITIES:  Without any calf tenderness, cyanosis or clubbing.   I have reviewed the CT scan with her dated June 18, 2006 and  emphasized that the mass that is seen is in the right hilum and appears  to extend medially into the suprahilar area adjacent to the ascending  thoracic aorta and is not likely to be seen on plain film. Her last  plain chest x-ray here reviewed from July 02, 2005 shows no evidence  of a definite mass.   I also emphasized that there is no evidence that this mass is actually  the cause of her paroxysmal nocturnal dyspnea which is more likely  related to COPD exacerbation from smoking. I discussed the differential  diagnosis with her which unfortunately does include   bronchocarcinogenic  carcinoma and recommended proceeding to bronchoscopy as soon as she can  schedule it. I discussed the risks, benefits, and alternatives with her  including the various approaches and spent a 25-minute visit in  discussion with her today. She did agree to the procedure tentatively  scheduled for July 17, 2006 at [facility].     Casimiro Needle B. Sherene Sires, MD, [facility]  Electronically Signed    MBW/MedQ  DD: 06/27/2006  DT: 06/28/2006  Job #: [id number]

## 2010-12-15 NOTE — Op Note (Signed)
NAME:  [name], [name]               ACCOUNT NO.:  [id number]   MEDICAL RECORD NO.:  [id number]          PATIENT TYPE:  OUT   LOCATION:  CARD                         FACILITY:  [facility]   PHYSICIAN:  Charlaine Dalton. Sherene Sires, MD, FCCPDATE OF BIRTH:  [birth year]-Dec-1928   DATE OF PROCEDURE:  07/17/2006  DATE OF DISCHARGE:                               OPERATIVE REPORT   PROCEDURE:  Fiberoptic bronchoscopy with Wang biopsy of the right upper  lobe orifice.   REFERRING PHYSICIAN:  This patient is self-referred.   HISTORY:  Please see detailed records in the E-chart dictation system.  The patient agreed to the procedure after a full discussion of risks,  benefits, and alternatives.   DESCRIPTION OF PROCEDURE:  She was premedicated with a total of 5 mg IV  Versed and 50 mg of IV Demerol for adequate sedation and cough  suppression while being continuous monitored by surface ECG, oximetry,  and receiving topical 1% lidocaine by updraft nebulizer.  The right  nares was prepared with 2% lidocaine jelly and then a standard flexible  fiberoptic bronchoscope was passed via the right nares with good  visualization of the entire oropharynx and larynx.  The cords moved  normally and there were no apparent upper airway lesions.   Using an additional 1% lidocaine, I viewed the entire tracheobronchial  tree with the following findings:  1. The trachea and carina were normal as well as the left-sided      airways.  2. The right sided airways below the right upper lobe were normal.  3. The right upper lobe orifice was definitely abnormal with swelling      that extended along the very distal aspect of the trachea into the      air divider between the right upper lobe and bronchus intermedius      which was with erythematous mucosa which was somewhat heaped up and      extended down to partially obstruct the right upper lobe but no      more than 50% of its maximum diameter was obstructed.  The      remainder of the  airways in the right upper lobe were normal and      there were no definite focal endobronchial mucosal abnormalities.   This area bulging erythema corresponded to the suprahilar mass on CT  scan and, therefore, a Wang biopsy x3 was done within the bulging tissue  with adequate return and no significant bleeding.   IMPRESSION:  Lung mass consistent with bronchogenic carcinoma partially  obstructing the right upper lobe anterior segment but no more than a 50%  severity.   RECOMMENDATIONS:  Await tissue confirmation.  Based on the anatomic  appearance, this would not appear to be resectable for cure because it  is too close to the right main stem bronchus orifice and by CT scan,  also extended into the mediastinum.      Charlaine Dalton. Sherene Sires, MD, [facility]  Electronically Signed    MBW/MEDQ  D:  07/17/2006  T:  07/17/2006  Job:  [id number]

## 2010-12-15 NOTE — Assessment & Plan Note (Signed)
[facility]                               PULMONARY OFFICE NOTE   NAME:[name], Bulls                      MRN:          [id number]  DATE:02/13/2006                            DOB:          August 11, 1928    HISTORY:  A 77 year old black female - active smoker with hypertension and  asthmatic bronchitis comes in today having not taken her Advair this morning  stating breathing is fine except when she smokes cigarettes or gets under  stress.   MEDICATIONS:  Please see face sheet February 13, 2006.  Presently, she denies  any chest pain, purulent sputum, fever, chills, sweats, orthopnea, PND, or  leg swelling.   PHYSICAL EXAMINATION:  GENERAL:  She is a chronically ill appearing thin  black female in no acute distress.  VITAL SIGNS:  Weight 92 pounds - down one pound from previous visit.  HEENT:  Unremarkable. Pharynx is clear except for minimal erythema.  NECK:  Supple without cervical adenopathy or tenderness.  Trachea is  midline.  No thyromegaly.  LUNGS:  Lung fields reveal diminished breath sounds but no wheezing.  HEART: There is regular rhythm without murmur, gallop or rub.  ABDOMEN:  Soft, benign.  EXTREMITIES:  Without calf tenderness, cyanosis, or clubbing.   PFTs were reviewed and indicate severe COPD changes with 26% improvement  after bronchodilators, albeit in the absence of Advair today.   IMPRESSION:  COPD with an asthmatic component being fueled by cigarettes and  the lack of  a compliance with Advair.  I have recommended again that she  stay on the Advair perfectly regularly and work harder on comitting to  smoking cessation.  I have referred her to a Quit-Smart-Program once she is  ready to quit.  1.  Hypertension - looks to be over controlled - at present I am going to      ask her to reduce Norvasc to 1/2 daily.  2.  Weight loss attributed to stress.  Status post treatment for      hypothyroidism a year ago with normalization of  thyroid functions last      documented in December of 2006 with no symptoms of hypothyroidism.  3.  Followup will be in three months or sooner as needed.                                   Charlaine Dalton. Sherene Sires, MD, [facility]P   MBW/MedQ  DD:  02/14/2006  DT:  02/14/2006  Job #:  [id number]

## 2010-12-15 NOTE — H&P (Signed)
NAME:  [name], [name]                         ACCOUNT NO.:  [id number]   MEDICAL RECORD NO.:  [id number]                   PATIENT TYPE:  INP   LOCATION:  1827                                 FACILITY:  [facility]   PHYSICIAN:  Oley Balm. Sung Amabile, M.D. [facility]          DATE OF BIRTH:  1929-07-10   DATE OF ADMISSION:  12/08/2002  DATE OF DISCHARGE:                                HISTORY & PHYSICAL   ADMISSION DIAGNOSES:  1. Severe left subscapular chest pain of unclear etiology.  2. Abnormal troponin-I level.  3. Chronic obstructive pulmonary disease.  4. Smoker.  5. History of hypertension and cerebrovascular accident.   HISTORY OF PRESENT ILLNESS:  The patient is a 77 year old African-American  woman seen by Dr. Delford Field recently for routine medical care.  She reports to  me that for the 2-3 days prior to this admission she felt general malaise  with no specific symptoms.  On the morning of this admission, she woke up  with severe left-sided subscapular chest pain.  It is made worse by  inspiration. It is constant and unrelenting.  She characterizes it as  feeling like indigestion.  It is nonpositional and nonexertional.  It  appears to be unaffected by meals.  She denies fevers, chills, and sweats.  She had had some cough with sputum production, not changed from her  baseline.  She denies hemoptysis.  She denies lower extremity edema and calf  tenderness.   PAST MEDICAL HISTORY:  1. Emphysema.  2. Chronic bronchitis.  3. Hypertension.  4. A CVA.  5. Status post partial hysterectomy.  6. Remote back surgery.   CURRENT MEDICATIONS:  1. Astelin nasal spray.  2. Nasonex nasal spray.  3. Toprol-XL 50 mg p.o. daily.  4. Prilosec over-the-counter.  5. Enteric-coated aspirin.  6. Norvasc 5 mg p.o. daily.   SOCIAL HISTORY:  She lives with her husband and son.  She functions  independently with all ADLs.  She continues to smoke more than one pack of  cigarettes per day.  She has no  history of significant occupational  exposures.   FAMILY HISTORY:  This has been previously reviewed and is noncontributory.   REVIEW OF SYSTEMS:  Negative for otalgia and sinus symptoms.  No neck pain  or nuchal rigidity.  No adenopathy.  No diarrhea, constipation, or dysuria.  She has had mild nausea without vomiting.  She also reports weight loss over  the past several months.  She is unable to quantify the exact amount of  weight loss.  Otherwise the review of systems is negative.   PHYSICAL EXAMINATION:  VITAL SIGNS:  Afebrile with normal vital signs.  Room  air oxygen saturation is 98%.  GENERAL:  She is tearful and appears to be in pain but no acute respiratory  distress.  HEENT:  Reveals cranial nerves to be intact.  NECK:  Supple without adenopathy or jugular venous distention.  CHEST:  Hyperresonance to percussion.  Breath sounds are moderately  diminished.  Chest examination reveals a few wheezes scattered bilaterally.  No rales, or rhonchi.  No pleural friction rub is heard.  CARDIAC:  Reveals a regular rate and rhythm with no murmurs noted.  ABDOMEN:  Soft with no hepatosplenomegaly.  EXTREMITIES:  Are without clubbing, cyanosis, or edema.  NEUROLOGICAL:  No focal deficits.   LABORATORY AND ACCESSORY DATA:  Chest x-ray reveals hyperinflation with no  acute infiltrates.  CT scan of the chest demonstrates no evidence of  pulmonary embolism.  She does have moderate changes of COPD including  emphysema and bronchitic changes.  There is also moderate nonspecific hilar  and mediastinal adenopathy noted.  EKG reveals nonspecific ST-T wave  changes.  No definite evidence of ischemia or infarction.   Basic chemistries are normal.  CK and MB fraction are normal.  Troponin-I is  mildly elevated at 0.39.   IMPRESSION AND PLAN:  1. Left subscapular chest pain of unclear etiology.  I suspect this is     pleurisy.  This is an unusual location for gastroesophageal reflux      disease or an acute coronary syndrome.  She will be admitted for     analgesia and ruled out for myocardial infarction.  Analgesia will be     with nonsteroidals as well as p.r.n. morphine and Percocet.  2. Chronic obstructive pulmonary disease--she does have a few scattered     wheezes on exam.  Therefore, she will be treated with a modest dose of     systemic steroids and scheduled nebulized bronchodilators.  3. History of hypertension and cerebrovascular accident.  Her     antihypertensives and aspirin will be continued.  4. I have counseled her regarding smoking cessation.  5. Dr. Sherene Sires will assume her care on the day following this admission.                                               Oley Balm Sung Amabile, M.D. [facility]    DBS/MEDQ  D:  12/08/2002  T:  12/09/2002  Job:  [id number]   cc:   Charlaine Dalton. Sherene Sires, M.D. [facility]

## 2010-12-15 NOTE — Assessment & Plan Note (Signed)
[facility]                             PULMONARY OFFICE NOTE   NAME:[name], [name]                      MRN:          [id number]  DATE:11/19/2006                            DOB:          1929-02-03    HISTORY:  A 77 year old black female, active smoker until March of 2008,  with a baseline FEV1 of around 53% predicted documented July of 2007,  states she has been more short of breath since she was given radiation  chemotherapy for a non small cell carcinoma obstructing the right upper  lobe.  She has been having increasing cough over the last several weeks  productive of white sputum.  She has run out of her Advair but does not  remember when in the context of all of her problems, does states that  she is some better after using Combivent for a few hours.  She denies  any pleuritic pain, hemoptysis, fevers, chills, sweats, orthopnea, PND  or leg swelling.   Chest x-ray was reviewed from today and shows COPD with decreased  fullness in the right hilum.   PHYSICAL EXAMINATION:  She is a chronically ill thin ambulatory black  female in no acute distress.  She is afebrile with normal vital signs.  Weight is 93 pounds which is no change to baseline.  HEENT:  Unremarkable.  Pharynx clear.  Lung fields reveal a few expiratory rhonchi, but no wheezes.  There is a regular rhythm without murmurs, gallops, or rubs.  Abdomen is soft, benign.  EXTREMITIES:  Warm without calf tenderness, cyanosis, clubbing, or  edema.   MDI and DPI technique were reviewed and improved to near 90% with  coaching.   IMPRESSION:  Chronic obstructive pulmonary disease with a component of  asthmatic bronchitis that is worse following radiation and chemotherapy,  but probably more related to non adherence than to any other adverse  effect from the medications.  Her chest x-ray looks actually better in  terms of aeration and hilar fullness.   She is due for a followup CT scan  May 28th, and I will see her back in  regular followup every 3 months.   In the meantime, it is critical that the patient actually be placed on  the medications that were intended prior to making additional changes in  the medicines.  For that reason I will recommend the following:   To treat her acute symptoms I recommended prednisone 10 mg tablets #14  to be tapered off over 6 days and then follow up at the end of a week to  see to what extent she responded to this, and also to what extent she  maintains improvement on Advair 250/50 b.i.d. taken perfectly regularly  (samples only given today).   When she returns for a trust but verify medication reconciliation and  generation of new medication calendar, I would like to first verify that  she is on all of the medicines she is supposed to be on before making  any additional changes, but we could offer her a nebulizer with combined  Brovana and budesonide if she  is not maintaining the same improvement.   Overall, I spent 25 minutes with this patient today going over medicines  with her in detail, and also teaching her optimal MDI and DPI technique.     Charlaine Dalton. Sherene Sires, MD, [facility]  Electronically Signed    MBW/MedQ  DD: 11/19/2006  DT: 11/19/2006  Job #: [id number]   cc:   Lajuana Matte, MD

## 2010-12-15 NOTE — Discharge Summary (Signed)
NAME:  [name], [name]               ACCOUNT NO.:  [id number]   MEDICAL RECORD NO.:  [id number]          PATIENT TYPE:  INP   LOCATION:  1323                         FACILITY[facility]  [facility]   PHYSICIAN:  Charlaine Dalton. Sherene Sires, MD, FCCPDATE OF BIRTH:  [birth year]   DATE OF ADMISSION:  12/25/2007  DATE OF DISCHARGE:  01-08-08                               DISCHARGE SUMMARY   The patient was admitted on Dec 25, 2007, and expired on 01-08-2008.   FINAL DIAGNOSES:  1. 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  2. Chronic obstructive pulmonary disease.  3. Hypertension.  4. Osteoporosis.   HISTORY:  Please see H and P.  The patient was admitted with progressive  decline in mental status with documented brain metastases to the point  where she was bedridden.  She had already had radiation and chemotherapy  and was not felt to be a candidate for anything further other than  hospice per Dr. Arbutus Ped.  She was made no code blue at her and her  family's request and we kept her comfortable and she expired during the  hospitalization while comfort measures were being carried out.      Charlaine Dalton. Sherene Sires, MD, [facility]  Electronically Signed     MBW/MEDQ  D:  01/16/2008  T:  01/16/2008  Job:  [id number]

## 2011-04-25 LAB — COMPREHENSIVE METABOLIC PANEL
Alkaline Phosphatase: 41
BUN: 21
CO2: 19
Chloride: 108
Creatinine, Ser: 1.04
GFR calc non Af Amer: 51 — ABNORMAL LOW
Glucose, Bld: 119 — ABNORMAL HIGH
Potassium: 4.3
Total Bilirubin: 0.7

## 2011-04-25 LAB — URINALYSIS, ROUTINE W REFLEX MICROSCOPIC
Bilirubin Urine: NEGATIVE
Glucose, UA: NEGATIVE
Hgb urine dipstick: NEGATIVE
Ketones, ur: NEGATIVE
Ketones, ur: NEGATIVE
Leukocytes, UA: NEGATIVE
Nitrite: NEGATIVE
Nitrite: NEGATIVE
Protein, ur: NEGATIVE
Protein, ur: NEGATIVE
Specific Gravity, Urine: 1.013
Urobilinogen, UA: 0.2
Urobilinogen, UA: 0.2
pH: 5
pH: 5.5

## 2011-04-25 LAB — CK TOTAL AND CKMB (NOT AT ARMC)
Relative Index: INVALID
Total CK: 36

## 2011-04-25 LAB — BASIC METABOLIC PANEL
CO2: 20
Chloride: 110
GFR calc Af Amer: >60
Glucose, Bld: 83
Sodium: 138

## 2011-04-25 LAB — DIFFERENTIAL
Basophils Absolute: 0
Basophils Relative: 0
Eosinophils Absolute: 0
Eosinophils Relative: 0
Lymphocytes Relative: 9 — ABNORMAL LOW
Lymphs Abs: 0.6 — ABNORMAL LOW
Monocytes Absolute: 0.4
Monocytes Relative: 7
Neutro Abs: 5.4
Neutrophils Relative %: 84 — ABNORMAL HIGH

## 2011-04-25 LAB — CBC
HCT: 25.5 — ABNORMAL LOW
Hemoglobin: 8.9 — ABNORMAL LOW
MCHC: 34.7
MCV: 101.3 — ABNORMAL HIGH
Platelets: 108 — ABNORMAL LOW
RBC: 2.52 — ABNORMAL LOW
RDW: 15.5
WBC: 6.4

## 2011-04-25 LAB — CULTURE, BLOOD (ROUTINE X 2)
Culture: NO GROWTH
Culture: NO GROWTH

## 2011-04-25 LAB — URINE MICROSCOPIC-ADD ON

## 2011-04-25 LAB — PROTIME-INR: INR: 1.1

## 2011-04-25 LAB — LEGIONELLA ANTIGEN, URINE

## 2011-04-25 LAB — HEPATIC FUNCTION PANEL
AST: 35
Alkaline Phosphatase: 41
Bilirubin, Direct: 0.2
Total Bilirubin: 0.6

## 2011-04-25 LAB — URINE CULTURE
Colony Count: NO GROWTH
Special Requests: POSITIVE

## 2022-08-15 ENCOUNTER — Telehealth: Payer: Self-pay | Admitting: Internal Medicine

## 2022-08-15 NOTE — Telephone Encounter (Signed)
Received a request from Vir[place] Manifold attorney's office for medical records.  The request included a copy of the patient's death certificate - DOD 01/14/08.   I updated the chart with date of death and faxed request back stating all requests for medical records need to go to the [facility] HIM department.
